# Patient Record
Sex: Female | Born: 1970
Health system: Southern US, Community
[De-identification: ages and names within clinical notes are randomized; demographics above are authoritative.]

## PROBLEM LIST (undated history)

## (undated) HISTORY — PX: BREAST CYST ASPIRATION: SHX578

---

## 2012-05-26 ENCOUNTER — Ambulatory Visit (INDEPENDENT_AMBULATORY_CARE_PROVIDER_SITE_OTHER): Payer: BC Managed Care – PPO | Admitting: General Surgery

## 2014-04-06 ENCOUNTER — Other Ambulatory Visit: Payer: Self-pay

## 2014-04-06 ENCOUNTER — Ambulatory Visit (HOSPITAL_COMMUNITY)
Admission: RE | Admit: 2014-04-06 | Discharge: 2014-04-06 | Disposition: A | Discharge status: Home / Self Care | Payer: 59 | Source: Ambulatory Visit | Attending: Family Medicine | Admitting: Family Medicine

## 2014-04-06 ENCOUNTER — Other Ambulatory Visit (HOSPITAL_COMMUNITY): Payer: Self-pay | Admitting: Family Medicine

## 2014-04-06 ENCOUNTER — Other Ambulatory Visit: Payer: Self-pay | Admitting: Family Medicine

## 2014-04-06 DIAGNOSIS — R1013 Epigastric pain: Secondary | ICD-10-CM

## 2014-04-06 DIAGNOSIS — K922 Gastrointestinal hemorrhage, unspecified: Secondary | ICD-10-CM

## 2014-04-06 MED ORDER — IOHEXOL 300 MG/ML  SOLN
80.0000 mL | Freq: Once | INTRAMUSCULAR | Status: AC | PRN
  Administered 2014-04-06: 80 mL via INTRAVENOUS

## 2014-09-22 ENCOUNTER — Other Ambulatory Visit: Payer: Self-pay | Admitting: Obstetrics & Gynecology

## 2014-09-22 ENCOUNTER — Other Ambulatory Visit (HOSPITAL_COMMUNITY)
Admission: RE | Admit: 2014-09-22 | Discharge: 2014-09-22 | Disposition: A | Discharge status: Home / Self Care | Payer: 59 | Source: Ambulatory Visit | Attending: Obstetrics & Gynecology | Admitting: Obstetrics & Gynecology

## 2014-09-22 DIAGNOSIS — Z01419 Encounter for gynecological examination (general) (routine) without abnormal findings: Secondary | ICD-10-CM | POA: Diagnosis not present

## 2014-09-22 DIAGNOSIS — Z1151 Encounter for screening for human papillomavirus (HPV): Secondary | ICD-10-CM | POA: Insufficient documentation

## 2014-09-23 LAB — CYTOLOGY - PAP

## 2016-06-20 ENCOUNTER — Other Ambulatory Visit: Payer: Self-pay | Admitting: Obstetrics & Gynecology

## 2016-06-20 DIAGNOSIS — Z1231 Encounter for screening mammogram for malignant neoplasm of breast: Secondary | ICD-10-CM

## 2016-07-01 DIAGNOSIS — L858 Other specified epidermal thickening: Secondary | ICD-10-CM | POA: Diagnosis not present

## 2016-07-01 DIAGNOSIS — F5101 Primary insomnia: Secondary | ICD-10-CM | POA: Diagnosis not present

## 2016-07-24 ENCOUNTER — Ambulatory Visit (INDEPENDENT_AMBULATORY_CARE_PROVIDER_SITE_OTHER): Payer: 59 | Admitting: Psychology

## 2016-07-24 DIAGNOSIS — F4322 Adjustment disorder with anxiety: Secondary | ICD-10-CM

## 2016-07-31 ENCOUNTER — Ambulatory Visit
Admission: RE | Admit: 2016-07-31 | Discharge: 2016-07-31 | Disposition: A | Discharge status: Home / Self Care | Payer: 59 | Source: Ambulatory Visit | Attending: Obstetrics & Gynecology | Admitting: Obstetrics & Gynecology

## 2016-07-31 DIAGNOSIS — Z1231 Encounter for screening mammogram for malignant neoplasm of breast: Secondary | ICD-10-CM | POA: Diagnosis not present

## 2016-08-16 ENCOUNTER — Ambulatory Visit (INDEPENDENT_AMBULATORY_CARE_PROVIDER_SITE_OTHER): Payer: 59 | Admitting: Psychology

## 2016-08-16 DIAGNOSIS — F331 Major depressive disorder, recurrent, moderate: Secondary | ICD-10-CM | POA: Diagnosis not present

## 2016-08-16 DIAGNOSIS — F411 Generalized anxiety disorder: Secondary | ICD-10-CM

## 2016-08-20 ENCOUNTER — Ambulatory Visit (INDEPENDENT_AMBULATORY_CARE_PROVIDER_SITE_OTHER): Payer: 59 | Admitting: Psychology

## 2016-08-20 DIAGNOSIS — F411 Generalized anxiety disorder: Secondary | ICD-10-CM

## 2016-08-20 DIAGNOSIS — F331 Major depressive disorder, recurrent, moderate: Secondary | ICD-10-CM | POA: Diagnosis not present

## 2016-08-21 ENCOUNTER — Ambulatory Visit (INDEPENDENT_AMBULATORY_CARE_PROVIDER_SITE_OTHER): Payer: 59 | Admitting: Psychology

## 2016-08-21 DIAGNOSIS — F331 Major depressive disorder, recurrent, moderate: Secondary | ICD-10-CM | POA: Diagnosis not present

## 2016-08-21 DIAGNOSIS — F411 Generalized anxiety disorder: Secondary | ICD-10-CM | POA: Diagnosis not present

## 2016-08-27 ENCOUNTER — Ambulatory Visit (INDEPENDENT_AMBULATORY_CARE_PROVIDER_SITE_OTHER): Payer: 59 | Admitting: Psychology

## 2016-08-27 DIAGNOSIS — F331 Major depressive disorder, recurrent, moderate: Secondary | ICD-10-CM

## 2016-08-27 DIAGNOSIS — F411 Generalized anxiety disorder: Secondary | ICD-10-CM | POA: Diagnosis not present

## 2016-09-25 DIAGNOSIS — Z0000 Encounter for general adult medical examination without abnormal findings: Secondary | ICD-10-CM | POA: Diagnosis not present

## 2016-09-25 DIAGNOSIS — Z23 Encounter for immunization: Secondary | ICD-10-CM | POA: Diagnosis not present

## 2016-10-17 DIAGNOSIS — M5412 Radiculopathy, cervical region: Secondary | ICD-10-CM | POA: Diagnosis not present

## 2016-10-28 ENCOUNTER — Encounter (HOSPITAL_COMMUNITY): Payer: Self-pay | Admitting: *Deleted

## 2016-10-28 ENCOUNTER — Emergency Department (HOSPITAL_COMMUNITY)
Admission: EM | Admit: 2016-10-28 | Discharge: 2016-10-28 | Disposition: A | Discharge status: Home / Self Care | Payer: 59 | Attending: Emergency Medicine | Admitting: Emergency Medicine

## 2016-10-28 DIAGNOSIS — M5410 Radiculopathy, site unspecified: Secondary | ICD-10-CM | POA: Diagnosis not present

## 2016-10-28 DIAGNOSIS — M79622 Pain in left upper arm: Secondary | ICD-10-CM | POA: Diagnosis not present

## 2016-10-28 DIAGNOSIS — R29898 Other symptoms and signs involving the musculoskeletal system: Secondary | ICD-10-CM | POA: Diagnosis not present

## 2016-10-28 DIAGNOSIS — M79602 Pain in left arm: Secondary | ICD-10-CM | POA: Diagnosis not present

## 2016-10-28 MED ORDER — KETOROLAC TROMETHAMINE 30 MG/ML IJ SOLN
15.0000 mg | Freq: Once | INTRAMUSCULAR | Status: AC
  Administered 2016-10-28: 15 mg via INTRAMUSCULAR
  Filled 2016-10-28: qty 1

## 2016-10-28 NOTE — ED Notes (Signed)
Pt has been on prednisone and last day was last Friday and since then pain in left arm has been worse today she could not get out of bed , states her fingers are numb, pt can wiggle them has great cap refill  < 3 sec and radial pulse is strong, pt states  Pain radiates down arm , in armpit and into back feels better with arm over head

## 2016-10-28 NOTE — Discharge Instructions (Signed)
Please read instructions below. Please attend your appointment with your PCP today for follow up. You will likely need an MRI if these symptoms persist. Call your orthopedic specialist or the neurosurgeon for follow up on your pain. Apply ice to your shoulder for 20 minutes at a time. You can also apply heat if that provides you with more relief. You can take advil every 6 hours as needed for pain. Return to the ER for new or concerning symptoms.

## 2016-10-28 NOTE — ED Notes (Signed)
aasked pt if she felt she could drive and she states she got here and feels like she can get home really does not feel any better, has appointment with her pcp this afternoon at 445, told her I could give her more pain if she could find a ride home and she states she would wait  Till she sees her dr

## 2016-10-28 NOTE — ED Provider Notes (Signed)
[facility]-EMERGENCY DEPT Provider Note    By signing my name below, I, Earmon Phoenix, attest that this documentation has been prepared under the direction and in the presence of [place] Russo, PA-C. Electronically Signed: Earmon Phoenix, ED Scribe. 10/28/16. 10:28 AM.    History   Chief Complaint Chief Complaint  Patient presents with  . Arm Pain   The history is provided by the patient. No language interpreter was used.    Kayla Jackson is a 46 y.o. female who presents to the Emergency Department complaining of worsening LUE pain that began two weeks ago. She reports associated paresthesias and cold sensation of the left arm. She reports moving two weeks ago and then the pain began in the posterior left shoulder. She states the pain gradually went down the LUE to the hand. She was seen by her PCP and was prescribed Prednisone which helped temporarily (prednisone taper finished on Friday). She has also taken Ibuprofen with minimal relief.  Moving the LUE increases the pain. Elevating the LUE over her head helps alleviate the pain. She denies bruising, wounds, numbness, weakness of the LUE or any known trauma or injury.  Pt reported ulnar nerve impingement in triage, however states this is a personal diagnosis, has not been medically diagnosed with this.  History reviewed. No pertinent past medical history.  There are no active problems to display for this patient.   Past Surgical History:  Procedure Laterality Date  . BREAST CYST ASPIRATION Left    calcified milk duct  . CESAREAN SECTION      OB History    No data available       Home Medications    Prior to Admission medications   Not on File    Family History No family history on file.  Social History Social History  Substance Use Topics  . Smoking status: Never Smoker  . Smokeless tobacco: Never Used  . Alcohol use Yes     Comment: occ     Allergies   Patient has no known allergies.   Review of  Systems Review of Systems  Musculoskeletal: Positive for myalgias.  Skin: Negative for color change and wound.  Neurological: Negative for weakness and numbness.     Physical Exam Updated Vital Signs BP 119/70 (BP Location: Right Arm)   Pulse 76   Temp 97.9 F (36.6 C) (Oral)   Resp 17   Ht 5\' 5"  (1.651 m)   Wt 150 lb (68 kg)   SpO2 100%   BMI 24.96 kg/m   Physical Exam  Constitutional: She appears well-developed and well-nourished. No distress.  HENT:  Head: Normocephalic and atraumatic.  Eyes: Conjunctivae are normal.  Neck: Normal range of motion.  Cardiovascular: Normal rate and intact distal pulses.   Intact radial and ulnar pulses.  Pulmonary/Chest: Effort normal.  Musculoskeletal: Normal range of motion. She exhibits no edema or deformity.  No spinal or paraspinal tenderness. No TTP over trapezius muscle. Tenderness to posterior shoulder at axilla. Remainder of LUE non tender. Normal ROM of LUE including shoulder, elbow, wrist.  Neurological: She is alert. She displays normal reflexes. No sensory deficit. She exhibits normal muscle tone. Coordination normal.  5/5 strength to BUE with elbow flexion and extension as well as grip strength. Normal gait. Normal sensation to BUE.  Psychiatric: She has a normal mood and affect. Her behavior is normal.  Nursing note and vitals reviewed.    ED Treatments / Results  DIAGNOSTIC STUDIES: Oxygen Saturation is 100% on RA,  normal by my interpretation.   COORDINATION OF CARE: 10:13 AM- Will refer to specialist. Will order injection of Toradol. Pt verbalizes understanding and agrees to plan.  Medications  ketorolac (TORADOL) 30 MG/ML injection 15 mg (15 mg Intramuscular Given 10/28/16 1044)    Labs (all labs ordered are listed, but only abnormal results are displayed) Labs Reviewed - No data to display  EKG  EKG Interpretation None       Radiology No results found.  Procedures Procedures (including critical care  time)  Medications Ordered in ED Medications  ketorolac (TORADOL) 30 MG/ML injection 15 mg (15 mg Intramuscular Given 10/28/16 1044)     Initial Impression / Assessment and Plan / ED Course  I have reviewed the triage vital signs and the nursing notes.  Pertinent labs & imaging results that were available during my care of the patient were reviewed by me and considered in my medical decision making (see chart for details).     Pt presents with neck pain that is radiating down her left arm. There has been no recent trauma. Patient without focal neuro deficits. Per Nexus criteria, c-spine imaging is not indicated at this time. Pt has been previously evaluated by their PCP and was prescribed Prednisone which she has completed. No evidence of cervical nerve root compression on physical exam. DC w conservative home therapies. Advised to f/u with her PCP if symptoms persist. Pt is safe for discharge.  Patient discussed with Dr. Jacqulyn BathLong, who agrees with care plan.  Discussed results, findings, treatment and follow up. Patient advised of return precautions. Patient verbalized understanding and agreed with plan.  Final Clinical Impressions(s) / ED Diagnoses   Final diagnoses:  Radiculopathy affecting upper extremity    New Prescriptions There are no discharge medications for this patient.  I personally performed the services described in this documentation, which was scribed in my presence. The recorded information has been reviewed and is accurate.     Russo, [place] N, PA-C 10/28/16 1740    Maia PlanLong, Joshua G, MD 10/29/16 [phone]

## 2016-10-28 NOTE — ED Triage Notes (Signed)
Pt has been taking prednisone for 9 days for L ulnar nerve impingement.  Woke up this am with severe pain and tingling and cold sensation from L shoulder to L pinky.

## 2016-10-29 ENCOUNTER — Ambulatory Visit
Admission: RE | Admit: 2016-10-29 | Discharge: 2016-10-29 | Disposition: A | Discharge status: Home / Self Care | Payer: 59 | Source: Ambulatory Visit | Attending: Family Medicine | Admitting: Family Medicine

## 2016-10-29 ENCOUNTER — Other Ambulatory Visit: Payer: Self-pay | Admitting: Family Medicine

## 2016-10-29 DIAGNOSIS — M5412 Radiculopathy, cervical region: Secondary | ICD-10-CM

## 2016-10-29 DIAGNOSIS — M4802 Spinal stenosis, cervical region: Secondary | ICD-10-CM | POA: Diagnosis not present

## 2016-10-29 MED ORDER — GADOBENATE DIMEGLUMINE 529 MG/ML IV SOLN
14.0000 mL | Freq: Once | INTRAVENOUS | Status: AC | PRN
  Administered 2016-10-29: 14 mL via INTRAVENOUS

## 2016-10-30 ENCOUNTER — Other Ambulatory Visit: Payer: Self-pay

## 2016-10-30 DIAGNOSIS — M5412 Radiculopathy, cervical region: Secondary | ICD-10-CM | POA: Diagnosis not present

## 2016-10-31 DIAGNOSIS — M5020 Other cervical disc displacement, unspecified cervical region: Secondary | ICD-10-CM | POA: Diagnosis not present

## 2016-10-31 DIAGNOSIS — M5412 Radiculopathy, cervical region: Secondary | ICD-10-CM | POA: Diagnosis not present

## 2016-11-01 DIAGNOSIS — M5412 Radiculopathy, cervical region: Secondary | ICD-10-CM | POA: Diagnosis not present

## 2016-11-01 DIAGNOSIS — M5020 Other cervical disc displacement, unspecified cervical region: Secondary | ICD-10-CM | POA: Diagnosis not present

## 2016-11-06 DIAGNOSIS — M50223 Other cervical disc displacement at C6-C7 level: Secondary | ICD-10-CM | POA: Diagnosis not present

## 2016-11-06 DIAGNOSIS — M50123 Cervical disc disorder at C6-C7 level with radiculopathy: Secondary | ICD-10-CM | POA: Diagnosis not present

## 2016-11-26 DIAGNOSIS — M5020 Other cervical disc displacement, unspecified cervical region: Secondary | ICD-10-CM | POA: Diagnosis not present

## 2017-01-30 DIAGNOSIS — Z538 Procedure and treatment not carried out for other reasons: Secondary | ICD-10-CM | POA: Diagnosis not present

## 2017-02-28 DIAGNOSIS — R030 Elevated blood-pressure reading, without diagnosis of hypertension: Secondary | ICD-10-CM | POA: Diagnosis not present

## 2017-02-28 DIAGNOSIS — M5412 Radiculopathy, cervical region: Secondary | ICD-10-CM | POA: Diagnosis not present

## 2017-02-28 DIAGNOSIS — M5020 Other cervical disc displacement, unspecified cervical region: Secondary | ICD-10-CM | POA: Diagnosis not present

## 2017-03-27 DIAGNOSIS — L720 Epidermal cyst: Secondary | ICD-10-CM | POA: Diagnosis not present

## 2017-05-13 DIAGNOSIS — L720 Epidermal cyst: Secondary | ICD-10-CM | POA: Diagnosis not present

## 2017-10-08 DIAGNOSIS — Z13220 Encounter for screening for lipoid disorders: Secondary | ICD-10-CM | POA: Diagnosis not present

## 2017-10-08 DIAGNOSIS — Z5181 Encounter for therapeutic drug level monitoring: Secondary | ICD-10-CM | POA: Diagnosis not present

## 2017-10-08 DIAGNOSIS — Z0000 Encounter for general adult medical examination without abnormal findings: Secondary | ICD-10-CM | POA: Diagnosis not present

## 2018-02-10 DIAGNOSIS — M25551 Pain in right hip: Secondary | ICD-10-CM | POA: Diagnosis not present

## 2018-02-23 NOTE — Progress Notes (Signed)
[name] [address] [place], [place] [address] Phone: [phone] Subjective:    I [name] am serving as a Neurosurgeon for Dr. Antoine Primas.   I'm seeing this patient by the request  of:  Shirlean Mylar, MD   CC: Right hip pain  [id number]  Kayla Jackson is a 47 y.o. female coming in with complaint of right hip pain. Some days she can barley walk. Sometimes wakes her up from sleep. Has pain in bilateral SI joints. Family history of RA.  Had been related bike more frequently but he did not seem to cause more pain.  Now walking patient does have to take a break.  States some mild back pain associated with it  Onset- Chronic Location- Lateral (deep) Character- Dull ache Aggravating factors- Walking, squatting, picking things up  Reliving factors-  Therapies tried-changing her workout routine Severity-sometimes 8 out of 10 and can stop her from walking     No past medical history on file. Past Surgical History:  Procedure Laterality Date  . BREAST CYST ASPIRATION Left    calcified milk duct  . CESAREAN SECTION     Social History   Socioeconomic History  . Marital status: Married    Spouse name: Not on file  . Number of children: Not on file  . Years of education: Not on file  . Highest education level: Not on file  Occupational History  . Not on file  Social Needs  . Financial resource strain: Not on file  . Food insecurity:    Worry: Not on file    Inability: Not on file  . Transportation needs:    Medical: Not on file    Non-medical: Not on file  Tobacco Use  . Smoking status: Never Smoker  . Smokeless tobacco: Never Used  Substance and Sexual Activity  . Alcohol use: Yes    Comment: occ  . Drug use: No  . Sexual activity: Not on file  Lifestyle  . Physical activity:    Days per week: Not on file    Minutes per session: Not on file  . Stress: Not on file  Relationships  . Social connections:    Talks on phone:  Not on file    Gets together: Not on file    Attends religious service: Not on file    Active member of club or organization: Not on file    Attends meetings of clubs or organizations: Not on file    Relationship status: Not on file  Other Topics Concern  . Not on file  Social History Narrative  . Not on file   No Known Allergies No family history on file. No current outpatient medications on file.    Past medical history, social, surgical and family history all reviewed in electronic medical record.  No pertanent information unless stated regarding to the chief complaint.   Review of Systems:  No headache, visual changes, nausea, vomiting, diarrhea, constipation, dizziness, abdominal pain, skin rash, fevers, chills, night sweats, weight loss, swollen lymph nodes, body aches, joint swelling,  chest pain, shortness of breath, mood changes.  Positive muscle aches  Objective  Blood pressure 102/86, pulse 76, height 5\' 5"  (1.651 m), weight 170 lb (77.1 kg), SpO2 99 %.    General: No apparent distress alert and oriented x3 mood and affect normal, dressed appropriately.  HEENT: Pupils equal, extraocular movements intact  Respiratory: Patient's speak in full sentences and does not appear short of  breath  Cardiovascular: No lower extremity edema, non tender, no erythema  Skin: Warm dry intact with no signs of infection or rash on extremities or on axial skeleton.  Abdomen: Soft nontender  Neuro: Cranial nerves II through XII are intact, neurovascularly intact in all extremities with 2+ DTRs and 2+ pulses.  Lymph: No lymphadenopathy of posterior or anterior cervical chain or axillae bilaterally.  Gait antalgic  MSK:  Non tender with full range of motion and good stability and symmetric strength and tone of shoulders, elbows, wrist,  knee and ankles bilaterally.  Hip: Right hip ROM IR: 25 Deg, ER: 45 Deg, Flexion: 120 Deg, Extension: 100 Deg, Abduction: 45 Deg, Adduction: 45  Deg Strength IR: 5/5, ER: 5/5, Flexion: 5/5, Extension: 5/5, Abduction: 5/5, Adduction: 5/5 Pelvic alignment unremarkable to inspection and palpation. Standing hip rotation and gait without trendelenburg sign / unsteadiness. Mild pain over the greater trochanteric area but positive grind test with internal range of motion compression of the joint Moderate pain of the right sacroiliac joint  Osteopathic findingsft T3 extended rotated and side bent right inhaled third rib T9 extended rotated and side bent left L3 flexed rotated and side bent right Sacrum right on right    Impression and Recommendations:     This case required medical decision making of moderate complexity. The above documentation has been reviewed and is accurate and complete Kayla Saa, DO       Note: This dictation was prepared with Dragon dictation along with smaller phrase technology. Any transcriptional errors that result from this process are unintentional.

## 2018-02-24 ENCOUNTER — Other Ambulatory Visit (INDEPENDENT_AMBULATORY_CARE_PROVIDER_SITE_OTHER): Payer: 59

## 2018-02-24 ENCOUNTER — Ambulatory Visit (INDEPENDENT_AMBULATORY_CARE_PROVIDER_SITE_OTHER): Payer: 59 | Admitting: Family Medicine

## 2018-02-24 ENCOUNTER — Ambulatory Visit (INDEPENDENT_AMBULATORY_CARE_PROVIDER_SITE_OTHER)
Admission: RE | Admit: 2018-02-24 | Discharge: 2018-02-24 | Disposition: A | Discharge status: Home / Self Care | Payer: 59 | Source: Ambulatory Visit | Attending: Family Medicine | Admitting: Family Medicine

## 2018-02-24 ENCOUNTER — Encounter: Payer: Self-pay | Admitting: Family Medicine

## 2018-02-24 VITALS — BP 102/86 | HR 76 | Ht 65.0 in | Wt 170.0 lb

## 2018-02-24 DIAGNOSIS — G8929 Other chronic pain: Secondary | ICD-10-CM

## 2018-02-24 DIAGNOSIS — M999 Biomechanical lesion, unspecified: Secondary | ICD-10-CM | POA: Diagnosis not present

## 2018-02-24 DIAGNOSIS — M25851 Other specified joint disorders, right hip: Secondary | ICD-10-CM

## 2018-02-24 DIAGNOSIS — M533 Sacrococcygeal disorders, not elsewhere classified: Secondary | ICD-10-CM | POA: Diagnosis not present

## 2018-02-24 DIAGNOSIS — M2550 Pain in unspecified joint: Secondary | ICD-10-CM

## 2018-02-24 DIAGNOSIS — M25551 Pain in right hip: Secondary | ICD-10-CM

## 2018-02-24 DIAGNOSIS — M1611 Unilateral primary osteoarthritis, right hip: Secondary | ICD-10-CM | POA: Diagnosis not present

## 2018-02-24 LAB — IBC PANEL
Iron: 126 ug/dL (ref 42–145)
SATURATION RATIOS: 38.5 % (ref 20.0–50.0)
Transferrin: 234 mg/dL (ref 212.0–360.0)

## 2018-02-24 LAB — CBC WITH DIFFERENTIAL/PLATELET
BASOS ABS: 0 10*3/uL (ref 0.0–0.1)
BASOS PCT: 0.8 % (ref 0.0–3.0)
EOS ABS: 0.1 10*3/uL (ref 0.0–0.7)
Eosinophils Relative: 1.6 % (ref 0.0–5.0)
HCT: 41.2 % (ref 36.0–46.0)
HEMOGLOBIN: 14.2 g/dL (ref 12.0–15.0)
Lymphocytes Relative: 21.7 % (ref 12.0–46.0)
Lymphs Abs: 1.2 10*3/uL (ref 0.7–4.0)
MCHC: 34.5 g/dL (ref 30.0–36.0)
MCV: 89.8 fl (ref 78.0–100.0)
Monocytes Absolute: 0.4 10*3/uL (ref 0.1–1.0)
Monocytes Relative: 6.4 % (ref 3.0–12.0)
Neutro Abs: 4 10*3/uL (ref 1.4–7.7)
Neutrophils Relative %: 69.5 % (ref 43.0–77.0)
PLATELETS: 215 10*3/uL (ref 150.0–400.0)
RBC: 4.59 Mil/uL (ref 3.87–5.11)
RDW: 12.4 % (ref 11.5–15.5)
WBC: 5.7 10*3/uL (ref 4.0–10.5)

## 2018-02-24 LAB — COMPREHENSIVE METABOLIC PANEL
ALBUMIN: 4.6 g/dL (ref 3.5–5.2)
ALT: 13 U/L (ref 0–35)
AST: 13 U/L (ref 0–37)
Alkaline Phosphatase: 42 U/L (ref 39–117)
BUN: 15 mg/dL (ref 6–23)
CHLORIDE: 105 meq/L (ref 96–112)
CO2: 28 meq/L (ref 19–32)
CREATININE: 0.75 mg/dL (ref 0.40–1.20)
Calcium: 9.3 mg/dL (ref 8.4–10.5)
GFR: 86.99 mL/min (ref >60.00)
Glucose, Bld: 102 mg/dL — ABNORMAL HIGH (ref 70–99)
Potassium: 3.9 mEq/L (ref 3.5–5.1)
SODIUM: 139 meq/L (ref 135–145)
Total Bilirubin: 0.7 mg/dL (ref 0.2–1.2)
Total Protein: 7.7 g/dL (ref 6.0–8.3)

## 2018-02-24 LAB — SEDIMENTATION RATE: Sed Rate: 8 mm/hr (ref 0–20)

## 2018-02-24 LAB — C-REACTIVE PROTEIN

## 2018-02-24 LAB — URIC ACID: Uric Acid, Serum: 3.7 mg/dL (ref 2.4–7.0)

## 2018-02-24 LAB — TSH: TSH: 1.39 u[IU]/mL (ref 0.35–4.50)

## 2018-02-24 LAB — FERRITIN: Ferritin: 126.6 ng/mL (ref 10.0–291.0)

## 2018-02-24 NOTE — Assessment & Plan Note (Signed)
Decision today to treat with OMT was based on Physical Exam  After verbal consent patient was treated with HVLA, ME, FPR techniques in  thoracic, lumbar and sacral areas  Patient tolerated the procedure well with improvement in symptoms  Patient given exercises, stretches and lifestyle modifications  See medications in patient instructions if given  Patient will follow up in 4 weeks 

## 2018-02-24 NOTE — Assessment & Plan Note (Signed)
Sacroiliac joint dysfunction.  Discussed posture, hip abductor strengthening, icing regimen.  Follow-up again in 4 to 6 weeks

## 2018-02-24 NOTE — Assessment & Plan Note (Signed)
Concern for the right hip impingement.  Patient seems to be more anterior.  Patient has been landing on bike and this could be secondary to the positioning.  Sacroiliac dysfunction also noted.  Attempted osteopathic manipulation.  With patient's family history will get laboratory work-up to rule out autoimmune disease or any other type of inflammatory markers that could be contributing.  We discussed posture and ergonomics, we discussed proper shoes.  Discussed icing regimen and topical anti-inflammatories given.  Follow-up again in 3 to 4 weeks for further evaluation and treatment

## 2018-02-24 NOTE — Patient Instructions (Signed)
Good to see you  We will get labs and xray downstairs Exercises 3 times a week.  pennsaid pinkie amount topically 2 times daily as needed.  Ice 20 minutes 2 times daily. Usually after activity and before bed. Lower the seat height about 1/2 inch or so.  Lets see what the labs show and see what else we need to do  See me again in 3-4 weeks and see how long the manipulation held you

## 2018-02-27 LAB — VITAMIN D 1,25 DIHYDROXY
VITAMIN D3 1, 25 (OH): 38 pg/mL
Vitamin D 1, 25 (OH)2 Total: 38 pg/mL (ref 18–72)
Vitamin D2 1, 25 (OH)2: <8 pg/mL

## 2018-02-27 LAB — CYCLIC CITRUL PEPTIDE ANTIBODY, IGG: Cyclic Citrullin Peptide Ab: 19 UNITS

## 2018-02-27 LAB — ANTI-NUCLEAR AB-TITER (ANA TITER): ANA Titer 1: 1:40 {titer} — ABNORMAL HIGH

## 2018-02-27 LAB — ANGIOTENSIN CONVERTING ENZYME: ANGIOTENSIN-CONVERTING ENZYME: 30 U/L (ref 9–67)

## 2018-02-27 LAB — ANA: Anti Nuclear Antibody(ANA): POSITIVE — AB

## 2018-02-27 LAB — RHEUMATOID FACTOR

## 2018-02-27 LAB — CALCIUM, IONIZED: CALCIUM ION: 4.93 mg/dL (ref 4.8–5.6)

## 2018-02-27 LAB — PTH, INTACT AND CALCIUM
CALCIUM: 9.5 mg/dL (ref 8.6–10.2)
PTH: 49 pg/mL (ref 14–64)

## 2018-03-19 NOTE — Progress Notes (Signed)
[facility] [address] [place], [place] [address] Phone: [phone] Subjective:   Kayla Jackson, am serving as a scribe for Dr. Antoine Primas.  I'm seeing this patient by the request  of:    CC: Right hip and back pain follow-up  [id number]  Kayla Jackson is a 47 y.o. female coming in with complaint of back pain. She said that she is improving. Her SI joint pain has decreased. Her right hip continues to bother her. Pain with walking on anterior thigh. Is able to ride stationary bike without problems. Pain is sharp. Did try exercises which did not help.        History reviewed. No pertinent past medical history. Past Surgical History:  Procedure Laterality Date  . BREAST CYST ASPIRATION Left    calcified milk duct  . CESAREAN SECTION     Social History   Socioeconomic History  . Marital status: Married    Spouse name: Not on file  . Number of children: Not on file  . Years of education: Not on file  . Highest education level: Not on file  Occupational History  . Not on file  Social Needs  . Financial resource strain: Not on file  . Food insecurity:    Worry: Not on file    Inability: Not on file  . Transportation needs:    Medical: Not on file    Non-medical: Not on file  Tobacco Use  . Smoking status: Never Smoker  . Smokeless tobacco: Never Used  Substance and Sexual Activity  . Alcohol use: Yes    Comment: occ  . Drug use: No  . Sexual activity: Not on file  Lifestyle  . Physical activity:    Days per week: Not on file    Minutes per session: Not on file  . Stress: Not on file  Relationships  . Social connections:    Talks on phone: Not on file    Gets together: Not on file    Attends religious service: Not on file    Active member of club or organization: Not on file    Attends meetings of clubs or organizations: Not on file    Relationship status: Not on file  Other Topics Concern  . Not on file  Social  History Narrative  . Not on file   No Known Allergies History reviewed. No pertinent family history.  No family history [place] No current outpatient medications on file.    Past medical history, social, surgical and family history all reviewed in electronic medical record.  No pertanent information unless stated regarding to the chief complaint.   Review of Systems:  No headache, visual changes, nausea, vomiting, diarrhea, constipation, dizziness, abdominal pain, skin rash, fevers, chills, night sweats, weight loss, swollen lymph nodes, body aches, joint swelling,  chest pain, shortness of breath, mood changes.  Positive muscle aches  Objective  Blood pressure 122/88, pulse 84, height 5\' 5"  (1.651 m), weight 175 lb (79.4 kg), SpO2 98 %.    General: No apparent distress alert and oriented x3 mood and affect normal, dressed appropriately.  HEENT: Pupils equal, extraocular movements intact  Respiratory: Patient's speak in full sentences and does not appear short of breath  Cardiovascular: No lower extremity edema, non tender, no erythema  Skin: Warm dry intact with no signs of infection or rash on extremities or on axial skeleton.  Abdomen: Soft nontender  Neuro: Cranial nerves II through XII are intact, neurovascularly  intact in all extremities with 2+ DTRs and 2+ pulses.  Lymph: No lymphadenopathy of posterior or anterior cervical chain or axillae bilaterally.  Gait normal with good balance and coordination.  MSK:  Non tender with full range of motion and good stability and symmetric strength and tone of shoulders, elbows, wrist, , knee and ankles bilaterally.  Impingement noted with internal range of motion and mild flexion on left forward flexion.  Mild positive Pearlean BrownieFaber as well.  Osteopathic findings C4 flexed rotated and side bent left C6 flexed rotated and side bent left T3 extended rotated and side bent right inhaled third rib T8 extended rotated and side bent left L1 flexed  rotated and side bent right Sacrum right on right    Impression and Recommendations:     This case required medical decision making of moderate complexity. The above documentation has been reviewed and is accurate and complete Kayla SaaZachary M Kenly Xiao, DO       Note: This dictation was prepared with Dragon dictation along with smaller phrase technology. Any transcriptional errors that result from this process are unintentional.

## 2018-03-20 ENCOUNTER — Ambulatory Visit (INDEPENDENT_AMBULATORY_CARE_PROVIDER_SITE_OTHER): Payer: 59 | Admitting: Family Medicine

## 2018-03-20 ENCOUNTER — Encounter: Payer: Self-pay | Admitting: Family Medicine

## 2018-03-20 VITALS — BP 122/88 | HR 84 | Ht 65.0 in | Wt 175.0 lb

## 2018-03-20 DIAGNOSIS — M25851 Other specified joint disorders, right hip: Secondary | ICD-10-CM | POA: Diagnosis not present

## 2018-03-20 DIAGNOSIS — M999 Biomechanical lesion, unspecified: Secondary | ICD-10-CM | POA: Diagnosis not present

## 2018-03-20 NOTE — Patient Instructions (Addendum)
Good to see you  Kayla Jackson is your friend Stay active Keep it up  We will watch the hip  See me again in 5-6 weeks

## 2018-03-20 NOTE — Assessment & Plan Note (Signed)
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  Discussed icing regimen and home exercise.  Which activities to do.  Patient will follow-up again in 4 to 6 weeks.  Worsening symptoms consider injection

## 2018-04-23 ENCOUNTER — Ambulatory Visit: Payer: 59

## 2018-04-29 ENCOUNTER — Encounter: Payer: Self-pay | Admitting: Family Medicine

## 2018-04-29 ENCOUNTER — Ambulatory Visit (INDEPENDENT_AMBULATORY_CARE_PROVIDER_SITE_OTHER): Payer: 59 | Admitting: Family Medicine

## 2018-04-29 VITALS — BP 110/70 | HR 74 | Ht 65.0 in | Wt 168.0 lb

## 2018-04-29 DIAGNOSIS — M533 Sacrococcygeal disorders, not elsewhere classified: Secondary | ICD-10-CM

## 2018-04-29 DIAGNOSIS — M999 Biomechanical lesion, unspecified: Secondary | ICD-10-CM

## 2018-04-29 NOTE — Progress Notes (Signed)
[facility] [address] [place], [place] [address] Phone: [phone] Subjective:    I Kayla Jackson am serving as a Neurosurgeon for Dr. Antoine Primas.    CC: Right hip pain follow-up  [id number]  Kayla Jackson is a 48 y.o. female coming in with complaint of right hip pain. States that she is slowly getting better. Patient is x-ray showing that there is a possibility for possible impingement.  In addition to this patient does have more sacroiliac dysfunction.  Does state that she is making improvement.  Never went to formal physical therapy.  Patient denies any true radiation down the leg at the moment.  Patient has been a little more active and has started doing such things as yoga and cycling somewhat.     No past medical history on file. Past Surgical History:  Procedure Laterality Date  . BREAST CYST ASPIRATION Left    calcified milk duct  . CESAREAN SECTION     Social History   Socioeconomic History  . Marital status: Married    Spouse name: Not on file  . Number of children: Not on file  . Years of education: Not on file  . Highest education level: Not on file  Occupational History  . Not on file  Social Needs  . Financial resource strain: Not on file  . Food insecurity:    Worry: Not on file    Inability: Not on file  . Transportation needs:    Medical: Not on file    Non-medical: Not on file  Tobacco Use  . Smoking status: Never Smoker  . Smokeless tobacco: Never Used  Substance and Sexual Activity  . Alcohol use: Yes    Comment: occ  . Drug use: No  . Sexual activity: Not on file  Lifestyle  . Physical activity:    Days per week: Not on file    Minutes per session: Not on file  . Stress: Not on file  Relationships  . Social connections:    Talks on phone: Not on file    Gets together: Not on file    Attends religious service: Not on file    Active member of club or organization: Not on file    Attends meetings of  clubs or organizations: Not on file    Relationship status: Not on file  Other Topics Concern  . Not on file  Social History Narrative  . Not on file   No Known Allergies No family history on file. No current outpatient medications on file.    Past medical history, social, surgical and family history all reviewed in electronic medical record.  No pertanent information unless stated regarding to the chief complaint.   Review of Systems:  No headache, visual changes, nausea, vomiting, diarrhea, constipation, dizziness, abdominal pain, skin rash, fevers, chills, night sweats, weight loss, swollen lymph nodes, body aches, joint swelling, muscle aches, chest pain, shortness of breath, mood changes.   Objective  There were no vitals taken for this visit. Systems examined below as of    General: No apparent distress alert and oriented x3 mood and affect normal, dressed appropriately.  HEENT: Pupils equal, extraocular movements intact  Respiratory: Patient's speak in full sentences and does not appear short of breath  Cardiovascular: No lower extremity edema, non tender, no erythema  Skin: Warm dry intact with no signs of infection or rash on extremities or on axial skeleton.  Abdomen: Soft nontender  Neuro: Cranial nerves  II through XII are intact, neurovascularly intact in all extremities with 2+ DTRs and 2+ pulses.  Lymph: No lymphadenopathy of posterior or anterior cervical chain or axillae bilaterally.  Gait normal with good balance and coordination.  MSK:  Non tender with full range of motion and good stability and symmetric strength and tone of shoulders, elbows, wrist, , knee and ankles bilaterally.  Back Exam:  Inspection: Mild loss of lordosis Motion: Flexion 45 deg, Extension 35 deg, Side Bending to 25 deg bilaterally,  Rotation to 35 deg bilaterally  SLR laying: Negative  XSLR laying: Negative  Palpable tenderness: Tightness around the right sacroiliac joint. FABER:  Tightness of the right. Sensory change: Gross sensation intact to all lumbar and sacral dermatomes.  Reflexes: 2+ at both patellar tendons, 2+ at achilles tendons, Babinski's downgoing.  Strength at foot  Plantar-flexion: 5/5 Dorsi-flexion: 5/5 Eversion: 5/5 Inversion: 5/5  Leg strength  Quad: 5/5 Hamstring: 5/5 Hip flexor: 5/5 Hip abductors: 4/5 right compared to left Gait unremarkable.  Osteopathic findings C6 flexed rotated and side bent left T9 extended rotated and side bent left L3 flexed rotated and side bent right Sacrum right on right    Impression and Recommendations:     This case required medical decision making of moderate complexity. The above documentation has been reviewed and is accurate and complete Kayla Jackson       Note: This dictation was prepared with Dragon dictation along with smaller phrase technology. Any transcriptional errors that result from this process are unintentional.

## 2018-04-29 NOTE — Assessment & Plan Note (Signed)
Mostly sacroiliac dysfunction.  Continue to work on hip abductor strengthening.  Discussed the possibility of formal physical therapy.  Patient does have x-ray imaging of the hip that does show potential for impingement and could be contributing to some of the discomfort and pain.  Follow-up with me again in 4 to 6 weeks

## 2018-04-29 NOTE — Assessment & Plan Note (Signed)
Decision today to treat with OMT was based on Physical Exam  After verbal consent patient was treated with HVLA, ME, FPR techniques in , thoracic, lumbar and sacral areas  Patient tolerated the procedure well with improvement in symptoms  Patient given exercises, stretches and lifestyle modifications  See medications in patient instructions if given  Patient will follow up in 6-8 weeks 

## 2018-04-29 NOTE — Patient Instructions (Addendum)
Great to ee you  Overall you are doing well  Keep being active Core strength jis key  See me again in 8 weeks!

## 2018-06-16 ENCOUNTER — Ambulatory Visit: Payer: 59 | Admitting: Family Medicine

## 2018-06-19 ENCOUNTER — Ambulatory Visit: Payer: 59 | Admitting: Family Medicine

## 2018-07-16 ENCOUNTER — Ambulatory Visit: Payer: 59 | Admitting: Family Medicine

## 2018-08-18 ENCOUNTER — Encounter: Payer: Self-pay | Admitting: Family Medicine

## 2018-08-18 ENCOUNTER — Other Ambulatory Visit: Payer: Self-pay

## 2018-08-18 ENCOUNTER — Ambulatory Visit: Payer: 59 | Admitting: Family Medicine

## 2018-08-18 VITALS — BP 104/60 | HR 67 | Ht 65.0 in | Wt 168.0 lb

## 2018-08-18 DIAGNOSIS — M999 Biomechanical lesion, unspecified: Secondary | ICD-10-CM

## 2018-08-18 DIAGNOSIS — M25851 Other specified joint disorders, right hip: Secondary | ICD-10-CM | POA: Diagnosis not present

## 2018-08-18 NOTE — Progress Notes (Signed)
Tawana ScaleZach Alyss Granato D.O. [facility] [address] [place], [place] [address] Phone: [phone] Subjective:   Bruce Donath, Valerie Wolf, am serving as a scribe for Dr. Antoine PrimasZachary Moira Umholtz.   CC: Back and hip pain  OZH:[id number]:Subjective  Kayla Jackson is a 48 y.o. female coming in with complaint of back and hip pain. Patient states that her hip is hurting less frequently. Does still have pain with single leg activities and is scared to run because when the pain occurs it is worse than before. Lower back pain is at a dull ache. Hip impingement.  Patient is being evaluated occurs is worse with the frequency is better.  Not running as regularly.  Doing the exercises intermittently.    No past medical history on file. Past Surgical History:  Procedure Laterality Date  . BREAST CYST ASPIRATION Left    calcified milk duct  . CESAREAN SECTION     Social History   Socioeconomic History  . Marital status: Married    Spouse name: Not on file  . Number of children: Not on file  . Years of education: Not on file  . Highest education level: Not on file  Occupational History  . Not on file  Social Needs  . Financial resource strain: Not on file  . Food insecurity:    Worry: Not on file    Inability: Not on file  . Transportation needs:    Medical: Not on file    Non-medical: Not on file  Tobacco Use  . Smoking status: Never Smoker  . Smokeless tobacco: Never Used  Substance and Sexual Activity  . Alcohol use: Yes    Comment: occ  . Drug use: No  . Sexual activity: Not on file  Lifestyle  . Physical activity:    Days per week: Not on file    Minutes per session: Not on file  . Stress: Not on file  Relationships  . Social connections:    Talks on phone: Not on file    Gets together: Not on file    Attends religious service: Not on file    Active member of club or organization: Not on file    Attends meetings of clubs or organizations: Not on file    Relationship status: Not on file  Other  Topics Concern  . Not on file  Social History Narrative  . Not on file   No Known Allergies No family history on file.  No family history of autoimmune No current outpatient medications on file.    Past medical history, social, surgical and family history all reviewed in electronic medical record.  No pertanent information unless stated regarding to the chief complaint.   Review of Systems:  No headache, visual changes, nausea, vomiting, diarrhea, constipation, dizziness, abdominal pain, skin rash, fevers, chills, night sweats, weight loss, swollen lymph nodes, body aches, joint swelling, , chest pain, shortness of breath, mood changes.  Positive muscle aches  Objective  Blood pressure 104/60, pulse 67, height 5\' 5"  (1.651 m), weight 168 lb (76.2 kg), SpO2 98 %.    General: No apparent distress alert and oriented x3 mood and affect normal, dressed appropriately.  HEENT: Pupils equal, extraocular movements intact  Respiratory: Patient's speak in full sentences and does not appear short of breath  Cardiovascular: No lower extremity edema, non tender, no erythema  Skin: Warm dry intact with no signs of infection or rash on extremities or on axial skeleton.  Abdomen: Soft nontender  Neuro: Cranial nerves  II through XII are intact, neurovascularly intact in all extremities with 2+ DTRs and 2+ pulses.  Lymph: No lymphadenopathy of posterior or anterior cervical chain or axillae bilaterally.  Gait normal with good balance and coordination.  MSK:  Non tender with full range of motion and good stability and symmetric strength and tone of shoulders, elbows, wrist, hip, knee and ankles bilaterally.  Mild positive impingement on the right hip Back Exam:  Inspection: Unremarkable  Motion: Flexion 35 deg, Extension 15 deg, Side Bending to 45 deg bilaterally,  Rotation to 45 deg bilaterally  SLR laying: Negative  XSLR laying: Negative  Palpable tenderness: Tender to palpation paraspinal  musculature lumbar spine right greater than left) iliac joint. FABER: negative. Sensory change: Gross sensation intact to all lumbar and sacral dermatomes.  Reflexes: 2+ at both patellar tendons, 2+ at achilles tendons, Babinski's downgoing.  Strength at foot  Plantar-flexion: 5/5 Dorsi-flexion: 5/5 Eversion: 5/5 Inversion: 5/5  Leg strength  Quad: 5/5 Hamstring: 5/5 Hip flexor: 5/5 Hip abductors: 5/5  Gait unremarkable.  Osteopathic findings  C4 flexed rotated and side bent left C6 flexed rotated and side bent left T3 extended rotated and side bent right inhaled third rib T6 extended rotated and side bent left L3 flexed rotated and side bent right Sacrum right on right      Impression and Recommendations:     This case required medical decision making of moderate complexity. The above documentation has been reviewed and is accurate and complete [name], DO       Note: This dictation was prepared with Dragon dictation along with smaller phrase technology. Any transcriptional errors that result from this process are unintentional.

## 2018-08-18 NOTE — Patient Instructions (Signed)
Good to see you  Ice 20 minutes 2 times daily. Usually after activity and before bed. Keep working on hip abductors See me again in 6 weeks Be safe!

## 2018-08-18 NOTE — Assessment & Plan Note (Signed)
   Decision today to treat with OMT was based on Physical Exam  After verbal consent patient was treated with HVLA, ME, FPR techniques in  thoracic, lumbar and sacral areas  Patient tolerated the procedure well with improvement in symptoms  Patient given exercises, stretches and lifestyle modifications  See medications in patient instructions if given  Patient will follow up in6 weeks 

## 2018-08-18 NOTE — Assessment & Plan Note (Signed)
Impingement of the hip.  Discussed with patient in great length.  We discussed that this continues to give her some discomfort and pain.  Has responded well to manipulation for the sacroiliac joint.  Patient will hold on any other interventions such as injections.  Follow-up again in 6 weeks

## 2018-09-29 ENCOUNTER — Other Ambulatory Visit: Payer: Self-pay

## 2018-09-29 ENCOUNTER — Encounter: Payer: Self-pay | Admitting: Family Medicine

## 2018-09-29 ENCOUNTER — Ambulatory Visit (INDEPENDENT_AMBULATORY_CARE_PROVIDER_SITE_OTHER): Payer: 59 | Admitting: Family Medicine

## 2018-09-29 VITALS — BP 112/74 | HR 80 | Ht 65.0 in | Wt 167.0 lb

## 2018-09-29 DIAGNOSIS — M533 Sacrococcygeal disorders, not elsewhere classified: Secondary | ICD-10-CM | POA: Diagnosis not present

## 2018-09-29 DIAGNOSIS — M999 Biomechanical lesion, unspecified: Secondary | ICD-10-CM

## 2018-09-29 NOTE — Patient Instructions (Signed)
Good to see you.  When a flare 3 IBU 3x a day for 3 days Stretch after activity See me again in 5 weeks

## 2018-09-29 NOTE — Assessment & Plan Note (Signed)
Patient doing relatively well.  We discussed icing regimen and home exercises.  We discussed again about anti-inflammatories when having a flare.  We discussed core strengthening.  Has been doing very well with bike riding.  Follow-up again in 5 to 6 weeks

## 2018-09-29 NOTE — Progress Notes (Signed)
[facility] [place], [place] [address] Phone: [phone] Subjective:   Kayla Jackson, am serving as a scribe for Dr. Hulan Jackson.   CC: back and hip pain   GQQ:[id number]   08/18/2018: Impingement of the hip.  Discussed with patient in great length.  We discussed that this continues to give her some discomfort and pain.  Has responded well to manipulation for the sacroiliac joint.  Patient will hold on any other interventions such as injections.  Follow-up again in 6 weeks  Update 09/29/2018: Kayla Jackson is a 48 y.o. female coming in with complaint of right hip pain. States that the right side is better. One week after last visit she said she had a lot of pain but that has subsided. Is having pain over left SI joint. Pain is waking patient up at night. Has been walking more than usual with her kids.     Jackson past medical history on file. Past Surgical History:  Procedure Laterality Date  . BREAST CYST ASPIRATION Left    calcified milk duct  . CESAREAN SECTION     Social History   Socioeconomic History  . Marital status: Married    Spouse name: Not on file  . Number of children: Not on file  . Years of education: Not on file  . Highest education level: Not on file  Occupational History  . Not on file  Social Needs  . Financial resource strain: Not on file  . Food insecurity    Worry: Not on file    Inability: Not on file  . Transportation needs    Medical: Not on file    Non-medical: Not on file  Tobacco Use  . Smoking status: Never Smoker  . Smokeless tobacco: Never Used  Substance and Sexual Activity  . Alcohol use: Yes    Comment: occ  . Drug use: Jackson  . Sexual activity: Not on file  Lifestyle  . Physical activity    Days per week: Not on file    Minutes per session: Not on file  . Stress: Not on file  Relationships  . Social Herbalist on phone: Not on file    Gets together: Not on file    Attends  religious service: Not on file    Active member of club or organization: Not on file    Attends meetings of clubs or organizations: Not on file    Relationship status: Not on file  Other Topics Concern  . Not on file  Social History Narrative  . Not on file   Jackson Known Allergies Jackson family history on file.  Jackson family history of autoimmune Jackson current outpatient medications on file.    Past medical history, social, surgical and family history all reviewed in electronic medical record.  Jackson pertanent information unless stated regarding to the chief complaint.   Review of Systems:  Jackson headache, visual changes, nausea, vomiting, diarrhea, constipation, dizziness, abdominal pain, skin rash, fevers, chills, night sweats, weight loss, swollen lymph nodes, body aches, joint swelling, muscle aches, chest pain, shortness of breath, mood changes.   Objective  Blood pressure 112/74, pulse 80, height 5\' 5"  (1.651 Jackson), weight 167 lb (75.8 kg), SpO2 98 %.   General: Jackson apparent distress alert and oriented x3 mood and affect normal, dressed appropriately.  HEENT: Pupils equal, extraocular movements intact  Respiratory: Patient's speak in full sentences and does not appear short of breath  Cardiovascular: Jackson lower extremity edema, non tender, Jackson erythema  Skin: Warm dry intact with Jackson signs of infection or rash on extremities or on axial skeleton.  Abdomen: Soft nontender  Neuro: Cranial nerves II through XII are intact, neurovascularly intact in all extremities with 2+ DTRs and 2+ pulses.  Lymph: Jackson lymphadenopathy of posterior or anterior cervical chain or axillae bilaterally.  Gait normal with good balance and coordination.  MSK:  Non tender with full range of motion and good stability and symmetric strength and tone of shoulders, elbows, wrist, hip, knee and ankles bilaterally.    Back - Normal skin, Spine with normal alignment and Jackson deformity.  Jackson tenderness to vertebral process palpation.   Paraspinous muscles are not tender and without spasm.   Range of motion is full at neck and lumbar sacral regions  Right hip still mild anterior impingement.   Osteopathic findings  T9 extended rotated and side bent left L2 flexed rotated and side bent right Sacrum right on right    Impression and Recommendations:     This case required medical decision making of moderate complexity. The above documentation has been reviewed and is accurate and complete Kayla Jackson , DO       Note: This dictation was prepared with Dragon dictation along with smaller phrase technology. Any transcriptional errors that result from this process are unintentional.

## 2018-09-29 NOTE — Assessment & Plan Note (Signed)
Decision today to treat with OMT was based on Physical Exam  After verbal consent patient was treated with HVLA, ME, FPR techniques in  thoracic, lumbar and sacral areas  Patient tolerated the procedure well with improvement in symptoms  Patient given exercises, stretches and lifestyle modifications  See medications in patient instructions if given  Patient will follow up in 5-6 weeks 

## 2018-11-05 ENCOUNTER — Encounter: Payer: Self-pay | Admitting: Family Medicine

## 2018-11-05 ENCOUNTER — Other Ambulatory Visit: Payer: Self-pay

## 2018-11-05 ENCOUNTER — Ambulatory Visit (INDEPENDENT_AMBULATORY_CARE_PROVIDER_SITE_OTHER): Payer: 59 | Admitting: Family Medicine

## 2018-11-05 VITALS — BP 110/72 | HR 68 | Ht 65.0 in | Wt 164.4 lb

## 2018-11-05 DIAGNOSIS — M533 Sacrococcygeal disorders, not elsewhere classified: Secondary | ICD-10-CM | POA: Diagnosis not present

## 2018-11-05 DIAGNOSIS — M999 Biomechanical lesion, unspecified: Secondary | ICD-10-CM | POA: Diagnosis not present

## 2018-11-05 NOTE — Progress Notes (Signed)
Tawana ScaleZach Bebe Moncure D.O. [facility] [address] [place], [place] [address] Phone: [phone] Subjective:    Kayla EmoryI, Molly Weber, LAT, ATC, am serving as scribe for Dr. Antoine PrimasZachary Kajuana Shareef.  CC:  Back pain follow up   OZH:[id number]:Subjective    09/29/18: Patient doing relatively well.  We discussed icing regimen and home exercises.  We discussed again about anti-inflammatories when having a flare.  We discussed core strengthening.  Has been doing very well with bike riding.  Follow-up again in 5 to 6 weeks  11/05/18 Kayla Jackson is a 48 y.o. female coming in with complaint of R hip and low back pain.  She was last seen on 09/29/18 and notes that her symptoms are about the same.  The majority of her pain is in her R SIJ today.  Pt states that she's walking 3-4 miles several times a week and riding her [employer] bike.  She con't her HEP and has been using the IBU bursts when she has flares in her pain.     No past medical history on file. Past Surgical History:  Procedure Laterality Date  . BREAST CYST ASPIRATION Left    calcified milk duct  . CESAREAN SECTION     Social History   Socioeconomic History  . Marital status: Married    Spouse name: Not on file  . Number of children: Not on file  . Years of education: Not on file  . Highest education level: Not on file  Occupational History  . Not on file  Social Needs  . Financial resource strain: Not on file  . Food insecurity    Worry: Not on file    Inability: Not on file  . Transportation needs    Medical: Not on file    Non-medical: Not on file  Tobacco Use  . Smoking status: Never Smoker  . Smokeless tobacco: Never Used  Substance and Sexual Activity  . Alcohol use: Yes    Comment: occ  . Drug use: No  . Sexual activity: Not on file  Lifestyle  . Physical activity    Days per week: Not on file    Minutes per session: Not on file  . Stress: Not on file  Relationships  . Social Musicianconnections    Talks on phone: Not on file   Gets together: Not on file    Attends religious service: Not on file    Active member of club or organization: Not on file    Attends meetings of clubs or organizations: Not on file    Relationship status: Not on file  Other Topics Concern  . Not on file  Social History Narrative  . Not on file   No Known Allergies No family history on file.     Current Outpatient Medications (Analgesics):  .  ibuprofen (ADVIL) 600 MG tablet, Take 600 mg by mouth daily.      Past medical history, social, surgical and family history all reviewed in electronic medical record.  No pertanent information unless stated regarding to the chief complaint.   Review of Systems:  No headache, visual changes, nausea, vomiting, diarrhea, constipation, dizziness, abdominal pain, skin rash, fevers, chills, night sweats, weight loss, swollen lymph nodes, body aches, joint swelling,  chest pain, shortness of breath, mood changes. Positive muscle aches.   Objective  Blood pressure 110/72, pulse 68, height 5\' 5"  (1.651 m), weight 164 lb 6.4 oz (74.6 kg), SpO2 98 %.    General: No apparent distress alert  and oriented x3 mood and affect normal, dressed appropriately.  HEENT: Pupils equal, extraocular movements intact  Respiratory: Patient's speak in full sentences and does not appear short of breath  Cardiovascular: No lower extremity edema, non tender, no erythema  Skin: Warm dry intact with no signs of infection or rash on extremities or on axial skeleton.  Abdomen: Soft nontender  Neuro: Cranial nerves II through XII are intact, neurovascularly intact in all extremities with 2+ DTRs and 2+ pulses.  Lymph: No lymphadenopathy of posterior or anterior cervical chain or axillae bilaterally.  Gait antalgic  MSK:  tender with limited range of motion and good stability and symmetric strength and tone of shoulders, elbows, wrist, hip, knee and ankles bilaterally.  Back Exam:  Inspection: loss of lordosis  Motion:  Flexion 40 deg, Extension 35 deg, Side Bending to 25 deg bilaterally,  Rotation to 35 deg bilaterally  SLR laying: Negative  XSLR laying: Negative  Palpable tenderness: Tender to palpation. FABER: Tightness. Sensory change: Gross sensation intact to all lumbar and sacral dermatomes.  Reflexes: 2+ at both patellar tendons, 2+ at achilles tendons, Babinski's downgoing.  Strength at foot  Plantar-flexion: 5/5 Dorsi-flexion: 5/5 Eversion: 5/5 Inversion: 5/5  Leg strength  Quad: 5/5 Hamstring: 5/5 Hip flexor: 5/5 Hip abductors: 5/5   Osteopathic findings  T7 extended rotated and side bent left L2 flexed rotated and side bent right Sacrum right on right     Impression and Recommendations:     This case required medical decision making of moderate complexity. The above documentation has been reviewed and is accurate and complete Lyndal Pulley, DO       Note: This dictation was prepared with Dragon dictation along with smaller phrase technology. Any transcriptional errors that result from this process are unintentional.

## 2018-11-05 NOTE — Patient Instructions (Signed)
[place] or [place] for walking Also [employer] [address] or [place] [address]  Keep doing exercises  See me again in 4-5 weeks

## 2018-11-05 NOTE — Assessment & Plan Note (Signed)
Patient doing relatively well.  Continues to have tightness.  We discussed we could do advanced imaging but I do not think it would change medical management.  Discussed posture and ergonomics.  Patient will follow-up with me again in 4 to 8 weeks

## 2018-11-05 NOTE — Assessment & Plan Note (Signed)
Decision today to treat with OMT was based on Physical Exam  After verbal consent patient was treated with HVLA, ME, FPR techniques in  thoracic, lumbar and sacral areas  Patient tolerated the procedure well with improvement in symptoms  Patient given exercises, stretches and lifestyle modifications  See medications in patient instructions if given  Patient will follow up in 4-8 weeks 

## 2018-11-10 ENCOUNTER — Ambulatory Visit: Payer: 59 | Admitting: Family Medicine

## 2018-12-03 ENCOUNTER — Encounter: Payer: Self-pay | Admitting: Family Medicine

## 2018-12-03 ENCOUNTER — Other Ambulatory Visit: Payer: Self-pay

## 2018-12-03 ENCOUNTER — Ambulatory Visit (INDEPENDENT_AMBULATORY_CARE_PROVIDER_SITE_OTHER): Payer: 59 | Admitting: Family Medicine

## 2018-12-03 DIAGNOSIS — M999 Biomechanical lesion, unspecified: Secondary | ICD-10-CM

## 2018-12-03 DIAGNOSIS — M533 Sacrococcygeal disorders, not elsewhere classified: Secondary | ICD-10-CM | POA: Diagnosis not present

## 2018-12-03 NOTE — Assessment & Plan Note (Signed)
Stable overall.  Discussed posture and ergonomics.  Discussed which activities of doing which wants to avoid.  Patient is responded fairly well to osteopathic manipulation.  Follow-up again in 4 to 8 weeks.

## 2018-12-03 NOTE — Patient Instructions (Signed)
Good luck with the dog Keep up everything [place] on the knee See me again in 6-8 weeks

## 2018-12-03 NOTE — Progress Notes (Signed)
[name] [place], [place] [address] Phone: [phone] Subjective:     CC: Low back and neck pain follow-up  XLK:[id number]  Kayla Jackson is a 48 y.o. female coming in with complaint of back and neck pain follow-up.  Patient has had difficulty over the course of time.  Patient has been making some improvement.  Not having as much impingement of the hip.  Still having some unfortunately discomfort from time to time.      No past medical history on file. Past Surgical History:  Procedure Laterality Date  . BREAST CYST ASPIRATION Left    calcified milk duct  . CESAREAN SECTION     Social History   Socioeconomic History  . Marital status: Married    Spouse name: Not on file  . Number of children: Not on file  . Years of education: Not on file  . Highest education level: Not on file  Occupational History  . Not on file  Social Needs  . Financial resource strain: Not on file  . Food insecurity    Worry: Not on file    Inability: Not on file  . Transportation needs    Medical: Not on file    Non-medical: Not on file  Tobacco Use  . Smoking status: Never Smoker  . Smokeless tobacco: Never Used  Substance and Sexual Activity  . Alcohol use: Yes    Comment: occ  . Drug use: No  . Sexual activity: Not on file  Lifestyle  . Physical activity    Days per week: Not on file    Minutes per session: Not on file  . Stress: Not on file  Relationships  . Social Herbalist on phone: Not on file    Gets together: Not on file    Attends religious service: Not on file    Active member of club or organization: Not on file    Attends meetings of clubs or organizations: Not on file    Relationship status: Not on file  Other Topics Concern  . Not on file  Social History Narrative  . Not on file   No Known Allergies No family history on file.     Current Outpatient Medications (Analgesics):  .  ibuprofen (ADVIL)  600 MG tablet, Take 600 mg by mouth daily.      Past medical history, social, surgical and family history all reviewed in electronic medical record.  No pertanent information unless stated regarding to the chief complaint.   Review of Systems:  No headache, visual changes, nausea, vomiting, diarrhea, constipation, dizziness, abdominal pain, skin rash, fevers, chills, night sweats, weight loss, swollen lymph nodes, body aches, joint swelling, muscle aches, chest pain, shortness of breath, mood changes.  Positive muscle aches  Objective  f    General: No apparent distress alert and oriented x3 mood and affect normal, dressed appropriately.  HEENT: Pupils equal, extraocular movements intact  Respiratory: Patient's speak in full sentences and does not appear short of breath  Cardiovascular: No lower extremity edema, non tender, no erythema  Skin: Warm dry intact with no signs of infection or rash on extremities or on axial skeleton.  Abdomen: Soft nontender  Neuro: Cranial nerves II through XII are intact, neurovascularly intact in all extremities with 2+ DTRs and 2+ pulses.  Lymph: No lymphadenopathy of posterior or anterior cervical chain or axillae bilaterally.  Gait normal with good balance and coordination.  MSK:  Non tender with full range of motion and good stability and symmetric strength and tone of shoulders, elbows, wrist, hip, knee and ankles bilaterally.  Neck exam has mild loss of lordosis.  Low back pain still noted with paraspinal musculature tenderness mostly around L5-S1 right greater than left.  Positive right Faber test.  Negative straight leg test.  Neurovascular intact distally.  5 out of 5 strength  Osteopathic findings  T9 extended rotated and side bent left L2 flexed rotated and side bent right Sacrum right on right    Impression and Recommendations:     This case required medical decision making of moderate complexity. The above documentation has been  reviewed and is accurate and complete Judi SaaZachary M , DO       Note: This dictation was prepared with Dragon dictation along with smaller phrase technology. Any transcriptional errors that result from this process are unintentional.

## 2018-12-03 NOTE — Assessment & Plan Note (Signed)
Decision today to treat with OMT was based on Physical Exam  After verbal consent patient was treated with HVLA, ME, FPR techniques in  thoracic, lumbar and sacral areas  Patient tolerated the procedure well with improvement in symptoms  Patient given exercises, stretches and lifestyle modifications  See medications in patient instructions if given 4 Patient will follow up in-4-8  weeks

## 2019-01-18 NOTE — Progress Notes (Signed)
[facility] [address], [place] [address] Phone: [phone] Subjective:     CC: Low back pain follow-up  OEV:[id number]    12/03/18: Stable overall.  Discussed posture and ergonomics.  Discussed which activities of doing which wants to avoid.  Patient is responded fairly well to osteopathic manipulation.  Follow-up again in 4 to 8 weeks  Update- 01/19/19: Kayla Jackson is a 48 y.o. female coming in with complaint of back pain. Pain has increased since last visit as she has started doing a HIIT workout with her son. Has since discontinue workout. Denies any radicular symptoms.  Pain moves from right and left SI joint. Overall is feeling better today after discontinuing workouts. Will return to riding her Peloton later this week.      No past medical history on file. Past Surgical History:  Procedure Laterality Date  . BREAST CYST ASPIRATION Left    calcified milk duct  . CESAREAN SECTION     Social History   Socioeconomic History  . Marital status: Married    Spouse name: Not on file  . Number of children: Not on file  . Years of education: Not on file  . Highest education level: Not on file  Occupational History  . Not on file  Social Needs  . Financial resource strain: Not on file  . Food insecurity    Worry: Not on file    Inability: Not on file  . Transportation needs    Medical: Not on file    Non-medical: Not on file  Tobacco Use  . Smoking status: Never Smoker  . Smokeless tobacco: Never Used  Substance and Sexual Activity  . Alcohol use: Yes    Comment: occ  . Drug use: No  . Sexual activity: Not on file  Lifestyle  . Physical activity    Days per week: Not on file    Minutes per session: Not on file  . Stress: Not on file  Relationships  . Social Herbalist on phone: Not on file    Gets together: Not on file    Attends religious service: Not on file    Active member of club or organization: Not  on file    Attends meetings of clubs or organizations: Not on file    Relationship status: Not on file  Other Topics Concern  . Not on file  Social History Narrative  . Not on file   No Known Allergies No family history on file.     Current Outpatient Medications (Analgesics):  .  ibuprofen (ADVIL) 600 MG tablet, Take 600 mg by mouth daily.      Past medical history, social, surgical and family history all reviewed in electronic medical record.  No pertanent information unless stated regarding to the chief complaint.   Review of Systems:  No headache, visual changes, nausea, vomiting, diarrhea, constipation, dizziness, abdominal pain, skin rash, fevers, chills, night sweats, weight loss, swollen lymph nodes, body aches, joint swelling, chest pain, shortness of breath, mood changes.  Positive muscle aches  Objective  Blood pressure 110/70, pulse 84, SpO2 98 %.    General: No apparent distress alert and oriented x3 mood and affect normal, dressed appropriately.  HEENT: Pupils equal, extraocular movements intact  Respiratory: Patient's speak in full sentences and does not appear short of breath  Cardiovascular: No lower extremity edema, non tender, no erythema  Skin: Warm dry intact with no signs of infection or  rash on extremities or on axial skeleton.  Abdomen: Soft nontender  Neuro: Cranial nerves II through XII are intact, neurovascularly intact in all extremities with 2+ DTRs and 2+ pulses.  Lymph: No lymphadenopathy of posterior or anterior cervical chain or axillae bilaterally.  Gait normal with good balance and coordination.  MSK:  Non tender with full range of motion and good stability and symmetric strength and tone of shoulders, elbows, wrist, hip, knee and ankles bilaterally.  Back Exam:  Inspection: Loss of lordosis Motion: Flexion 45 deg, Extension 25 deg, Side Bending to 35 deg bilaterally,  Rotation to 45 deg bilaterally  SLR laying: Negative  XSLR laying:  Negative  Palpable tenderness: Tender to palpation in the paraspinal musculature lumbar spine.  Left greater than right. FABER: Positive left. Sensory change: Gross sensation intact to all lumbar and sacral dermatomes.  Reflexes: 2+ at both patellar tendons, 2+ at achilles tendons, Babinski's downgoing.  Strength at foot  Plantar-flexion: 5/5 Dorsi-flexion: 5/5 Eversion: 5/5 Inversion: 5/5  Leg strength  Quad: 5/5 Hamstring: 5/5 Hip flexor: 5/5 Hip abductors: 5/5  Gait unremarkable.  Osteopathic findings  T8 extended rotated and side bent left L2 flexed rotated and side bent right Sacrum left on left    Impression and Recommendations:     This case required medical decision making of moderate complexity. The above documentation has been reviewed and is accurate and complete [name], DO       Note: This dictation was prepared with Dragon dictation along with smaller phrase technology. Any transcriptional errors that result from this process are unintentional.

## 2019-01-19 ENCOUNTER — Other Ambulatory Visit: Payer: Self-pay

## 2019-01-19 ENCOUNTER — Ambulatory Visit (INDEPENDENT_AMBULATORY_CARE_PROVIDER_SITE_OTHER): Payer: 59 | Admitting: Family Medicine

## 2019-01-19 ENCOUNTER — Encounter: Payer: Self-pay | Admitting: Family Medicine

## 2019-01-19 VITALS — BP 110/70 | HR 84

## 2019-01-19 DIAGNOSIS — M533 Sacrococcygeal disorders, not elsewhere classified: Secondary | ICD-10-CM

## 2019-01-19 DIAGNOSIS — M999 Biomechanical lesion, unspecified: Secondary | ICD-10-CM

## 2019-01-19 NOTE — Assessment & Plan Note (Signed)
Decision today to treat with OMT was based on Physical Exam  After verbal consent patient was treated with HVLA, ME, FPR techniques in  thoracic, lumbar and sacral areas  Patient tolerated the procedure well with improvement in symptoms  Patient given exercises, stretches and lifestyle modifications  See medications in patient instructions if given  Patient will follow up in  6-8 weeks 

## 2019-01-19 NOTE — Patient Instructions (Signed)
Try pennsaid on back when needed Keep doing exercises  See me in 6-7 weeks

## 2019-01-19 NOTE — Assessment & Plan Note (Signed)
Discussed which activities to do away from his avoid.  Patient is to increase more of the core strengthening.  Discussed the possibility of a topical anti-inflammatories.  Responding well to manipulation and follow-up again in 6 weeks.

## 2019-03-02 ENCOUNTER — Ambulatory Visit (INDEPENDENT_AMBULATORY_CARE_PROVIDER_SITE_OTHER): Payer: 59 | Admitting: Family Medicine

## 2019-03-02 ENCOUNTER — Encounter: Payer: Self-pay | Admitting: Family Medicine

## 2019-03-02 VITALS — BP 118/72 | HR 74 | Ht 65.0 in | Wt 164.0 lb

## 2019-03-02 DIAGNOSIS — M999 Biomechanical lesion, unspecified: Secondary | ICD-10-CM

## 2019-03-02 DIAGNOSIS — M533 Sacrococcygeal disorders, not elsewhere classified: Secondary | ICD-10-CM | POA: Diagnosis not present

## 2019-03-02 MED ORDER — METHYLPREDNISOLONE ACETATE 80 MG/ML IJ SUSP
80.0000 mg | Freq: Once | INTRAMUSCULAR | Status: AC
  Administered 2019-03-02: 80 mg via INTRAMUSCULAR

## 2019-03-02 MED ORDER — KETOROLAC TROMETHAMINE 60 MG/2ML IM SOLN
60.0000 mg | Freq: Once | INTRAMUSCULAR | Status: AC
  Administered 2019-03-02: 60 mg via INTRAMUSCULAR

## 2019-03-02 MED ORDER — PREDNISONE 50 MG PO TABS: 50.0000 mg | ORAL_TABLET | Freq: Every day | ORAL | 0 refills | Status: DC

## 2019-03-02 MED ORDER — GABAPENTIN 100 MG PO CAPS: 200.0000 mg | ORAL_CAPSULE | Freq: Every day | ORAL | 3 refills | Status: AC

## 2019-03-02 NOTE — Patient Instructions (Signed)
Good to see you  Ice is your friend every 4 hours pennsaid pinkie amount topically 2 times daily as needed.  2 injections today  Prednisone daily starting tomorrow  Gabapentin 200mg  at night  See em again in 10-14 days

## 2019-03-02 NOTE — Assessment & Plan Note (Signed)
Severe exacerbation.  Discussed with patient in great length, we discussed home exercises and icing regimen, which activities to doing which wants to avoid.  Prednisone given, gabapentin given, Toradol and Depo-Medrol given today to help.  Patient will follow up with me again 2 weeks

## 2019-03-02 NOTE — Progress Notes (Signed)
[name] [address] [place], [place] [address] Phone: [phone] Subjective:   Bruce Donath, am serving as a scribe for Dr. Antoine Primas.    CC: Right-sided low back pain  LEX:[id number]  Kayla Jackson is a 48 y.o. female coming in with complaint of left SI joint pain. Patient states that she went for a long hike 2 weeks ago.  Patient states incident pain seems to be worsening again.  Patient states this is localized, no radiation of pain down the legs but states that the pain is 9 out of 10.  Not responding to any of the medications she has been prescribed previously     No past medical history on file. Past Surgical History:  Procedure Laterality Date  . BREAST CYST ASPIRATION Left    calcified milk duct  . CESAREAN SECTION     Social History   Socioeconomic History  . Marital status: Married    Spouse name: Not on file  . Number of children: Not on file  . Years of education: Not on file  . Highest education level: Not on file  Occupational History  . Not on file  Social Needs  . Financial resource strain: Not on file  . Food insecurity    Worry: Not on file    Inability: Not on file  . Transportation needs    Medical: Not on file    Non-medical: Not on file  Tobacco Use  . Smoking status: Never Smoker  . Smokeless tobacco: Never Used  Substance and Sexual Activity  . Alcohol use: Yes    Comment: occ  . Drug use: No  . Sexual activity: Not on file  Lifestyle  . Physical activity    Days per week: Not on file    Minutes per session: Not on file  . Stress: Not on file  Relationships  . Social Musician on phone: Not on file    Gets together: Not on file    Attends religious service: Not on file    Active member of club or organization: Not on file    Attends meetings of clubs or organizations: Not on file    Relationship status: Not on file  Other Topics Concern  . Not on file  Social History  Narrative  . Not on file   No Known Allergies no known drug allergies No family history on file.  No family history of autoimmune  Current Outpatient Medications (Endocrine & Metabolic):  .  predniSONE (DELTASONE) 50 MG tablet, Take 1 tablet (50 mg total) by mouth daily.    Current Outpatient Medications (Analgesics):  .  ibuprofen (ADVIL) 600 MG tablet, Take 600 mg by mouth daily.   Current Outpatient Medications (Other):  .  gabapentin (NEURONTIN) 100 MG capsule, Take 2 capsules (200 mg total) by mouth at bedtime.    Past medical history, social, surgical and family history all reviewed in electronic medical record.  No pertanent information unless stated regarding to the chief complaint.   Review of Systems:  No headache, visual changes, nausea, vomiting, diarrhea, constipation, dizziness, abdominal pain, skin rash, fevers, chills, night sweats, weight loss, swollen lymph nodes, body aches, joint swelling, chest pain, shortness of breath, mood changes.  Positive muscle aches  Objective  Blood pressure 118/72, pulse 74, height 5\' 5"  (1.651 m), weight 164 lb (74.4 kg), SpO2 95 %.    General: No apparent distress alert and oriented x3 mood and  affect normal, dressed appropriately.  HEENT: Pupils equal, extraocular movements intact  Respiratory: Patient's speak in full sentences and does not appear short of breath  Cardiovascular: No lower extremity edema, non tender, no erythema  Skin: Warm dry intact with no signs of infection or rash on extremities or on axial skeleton.  Abdomen: Soft nontender  Neuro: Cranial nerves II through XII are intact, neurovascularly intact in all extremities with 2+ DTRs and 2+ pulses.  Lymph: No lymphadenopathy of posterior or anterior cervical chain or axillae bilaterally.  Gait normal with good balance and coordination.  Very cautious MSK:  Non tender with full range of motion and good stability and symmetric strength and tone of shoulders,  elbows, wrist, hip, knee and ankles bilaterally.  Back exam does have some tender to palpation over the left sacroiliac joint more than previous.  Possible mild swelling.  Negative straight leg test.  Unable to do Corky Sox secondary to pain     Impression and Recommendations:     This case required medical decision making of moderate complexity. The above documentation has been reviewed and is accurate and complete Lyndal Pulley, DO       Note: This dictation was prepared with Dragon dictation along with smaller phrase technology. Any transcriptional errors that result from this process are unintentional.

## 2019-03-10 ENCOUNTER — Other Ambulatory Visit: Payer: Self-pay

## 2019-03-10 ENCOUNTER — Ambulatory Visit (INDEPENDENT_AMBULATORY_CARE_PROVIDER_SITE_OTHER): Payer: 59 | Admitting: Family Medicine

## 2019-03-10 ENCOUNTER — Encounter: Payer: Self-pay | Admitting: Family Medicine

## 2019-03-10 VITALS — BP 90/70 | HR 80 | Ht 65.0 in | Wt 174.0 lb

## 2019-03-10 DIAGNOSIS — M999 Biomechanical lesion, unspecified: Secondary | ICD-10-CM | POA: Diagnosis not present

## 2019-03-10 DIAGNOSIS — M533 Sacrococcygeal disorders, not elsewhere classified: Secondary | ICD-10-CM

## 2019-03-10 NOTE — Assessment & Plan Note (Signed)
Decision today to treat with OMT was based on Physical Exam  After verbal consent patient was treated with HVLA, ME, FPR techniques in  thoracic, lumbar and sacral areas  Patient tolerated the procedure well with improvement in symptoms  Patient given exercises, stretches and lifestyle modifications  See medications in patient instructions if given  Patient will follow up in 4-8 [place] weeks

## 2019-03-10 NOTE — Assessment & Plan Note (Signed)
Patient does have significant pain.  Positive Corky Sox.  Negative straight leg test.  Patient has responded better to manipulation and did have more improvement today.  Discussed which activities to do which wants to avoid.  Follow-up again 4 to 6 weeks

## 2019-03-10 NOTE — Progress Notes (Signed)
[facility] [address] [place], [place] [address] Phone: [phone] Subjective:   I Kayla Jackson am serving as a Neurosurgeon for Dr. Antoine Primas.   This visit occurred during the SARS-CoV-2 public health emergency.  Safety protocols were in place, including screening questions prior to the visit, additional usage of staff PPE, and extensive cleaning of exam room while observing appropriate contact time as indicated for disinfecting solutions.     CC: right hip pain   JKK:[id number]   03/02/2019 Severe exacerbation.  Discussed with patient in great length, we discussed home exercises and icing regimen, which activities to doing which wants to avoid.  Prednisone given, gabapentin given, Toradol and Depo-Medrol given today to help.  Patient will follow up with me again 2 weeks  Update 03/10/2019 Kayla Jackson is a 48 y.o. female coming in with complaint of back pain. Patient states she is doing better.  Patient continues to have significant discomfort and pain overall.  States that it has improved since the previous exam and feels that the injections were very helpful.     No past medical history on file. Past Surgical History:  Procedure Laterality Date  . BREAST CYST ASPIRATION Left    calcified milk duct  . CESAREAN SECTION     Social History   Socioeconomic History  . Marital status: Married    Spouse name: Not on file  . Number of children: Not on file  . Years of education: Not on file  . Highest education level: Not on file  Occupational History  . Not on file  Social Needs  . Financial resource strain: Not on file  . Food insecurity    Worry: Not on file    Inability: Not on file  . Transportation needs    Medical: Not on file    Non-medical: Not on file  Tobacco Use  . Smoking status: Never Smoker  . Smokeless tobacco: Never Used  Substance and Sexual Activity  . Alcohol use: Yes    Comment: occ  . Drug use: No  . Sexual  activity: Not on file  Lifestyle  . Physical activity    Days per week: Not on file    Minutes per session: Not on file  . Stress: Not on file  Relationships  . Social Musician on phone: Not on file    Gets together: Not on file    Attends religious service: Not on file    Active member of club or organization: Not on file    Attends meetings of clubs or organizations: Not on file    Relationship status: Not on file  Other Topics Concern  . Not on file  Social History Narrative  . Not on file   No Known Allergies No family history on file.  Current Outpatient Medications (Endocrine & Metabolic):  .  predniSONE (DELTASONE) 50 MG tablet, Take 1 tablet (50 mg total) by mouth daily.    Current Outpatient Medications (Analgesics):  .  ibuprofen (ADVIL) 600 MG tablet, Take 600 mg by mouth daily.   Current Outpatient Medications (Other):  .  gabapentin (NEURONTIN) 100 MG capsule, Take 2 capsules (200 mg total) by mouth at bedtime.    Past medical history, social, surgical and family history all reviewed in electronic medical record.  No pertanent information unless stated regarding to the chief complaint.   Review of Systems:  No headache, visual changes, nausea, vomiting, diarrhea, constipation, dizziness, abdominal  pain, skin rash, fevers, chills, night sweats, weight loss, swollen lymph nodes, body aches, joint swelling, muscle aches, chest pain, shortness of breath, mood changes.   Objective  There were no vitals taken for this visit. Systems examined below as of    General: No apparent distress alert and oriented x3 mood and affect normal, dressed appropriately.  HEENT: Pupils equal, extraocular movements intact  Respiratory: Patient's speak in full sentences and does not appear short of breath  Cardiovascular: No lower extremity edema, non tender, no erythema  Skin: Warm dry intact with no signs of infection or rash on extremities or on axial skeleton.   Abdomen: Soft nontender  Neuro: Cranial nerves II through XII are intact, neurovascularly intact in all extremities with 2+ DTRs and 2+ pulses.  Lymph: No lymphadenopathy of posterior or anterior cervical chain or axillae bilaterally.  Gait normal with good balance and coordination.  MSK:  Non tender with full range of motion and good stability and symmetric strength and tone of shoulders, elbows, wrist, knee and ankles bilaterally.  Right hip exam shows the patient does have pain over the right sacroiliac joint.  Mild positive FABER test.  Negative straight leg test.  Full internal range of motion.  Tender to palpation the paraspinal musculature.  .Osteopathic findings   T5 extended rotated and side bent left L2 flexed rotated and side bent right Sacrum right on right    Impression and Recommendations:     This case required medical decision making of moderate complexity. The above documentation has been reviewed and is accurate and complete Lyndal Pulley, DO       Note: This dictation was prepared with Dragon dictation along with smaller phrase technology. Any transcriptional errors that result from this process are unintentional.

## 2019-03-10 NOTE — Patient Instructions (Signed)
[place] for now Hike next weekend See me again in 3 weeks

## 2019-03-29 ENCOUNTER — Encounter: Payer: Self-pay | Admitting: *Deleted

## 2019-03-31 ENCOUNTER — Ambulatory Visit (INDEPENDENT_AMBULATORY_CARE_PROVIDER_SITE_OTHER): Payer: 59 | Admitting: Family Medicine

## 2019-03-31 ENCOUNTER — Encounter: Payer: Self-pay | Admitting: Family Medicine

## 2019-03-31 VITALS — BP 124/72 | HR 80 | Ht 65.0 in

## 2019-03-31 DIAGNOSIS — M999 Biomechanical lesion, unspecified: Secondary | ICD-10-CM

## 2019-03-31 DIAGNOSIS — M533 Sacrococcygeal disorders, not elsewhere classified: Secondary | ICD-10-CM | POA: Diagnosis not present

## 2019-03-31 NOTE — Assessment & Plan Note (Signed)
Decision today to treat with OMT was based on Physical Exam  After verbal consent patient was treated with HVLA, ME, FPR techniques in , thoracic, lumbar and sacral areas  Patient tolerated the procedure well with improvement in symptoms  Patient given exercises, stretches and lifestyle modifications  See medications in patient instructions if given  Patient will follow up in 4-8 weeks 

## 2019-03-31 NOTE — Assessment & Plan Note (Signed)
Patient doing significantly better than previous exam.  Discussed retractors to do x-rays to avoid.  Discussed icing regimen and home exercises.  Patient is to increase activity as tolerated.  Follow-up with me again as leg aches.

## 2019-03-31 NOTE — Patient Instructions (Addendum)
  [address] [place], [place] [address] Phone [phone]  See me again in 5-7 weeks

## 2019-03-31 NOTE — Progress Notes (Signed)
[name] [address] [place], [place] [address] Phone: [phone] Subjective:    I'm seeing this patient by the request  of:    CC: Low back pain follow-up  JKK:[id number]  Kayla Jackson is a 48 y.o. female coming in with complaint of back pain. Last seen for OMT on 03/10/2019. Patient states that she has been doing well since last visit. No new issues.  Patient initially was found to have some arthritic changes.  Patient did have more of a radicular symptoms as well.  Patient is doing significantly better and is near her baseline still some mild aches and pains.  Has not been quite as active as usual either.    No past medical history on file. Past Surgical History:  Procedure Laterality Date  . BREAST CYST ASPIRATION Left    calcified milk duct  . CESAREAN SECTION     Social History   Socioeconomic History  . Marital status: Married    Spouse name: Not on file  . Number of children: Not on file  . Years of education: Not on file  . Highest education level: Not on file  Occupational History  . Not on file  Tobacco Use  . Smoking status: Never Smoker  . Smokeless tobacco: Never Used  Substance and Sexual Activity  . Alcohol use: Yes    Comment: occ  . Drug use: No  . Sexual activity: Not on file  Other Topics Concern  . Not on file  Social History Narrative  . Not on file   Social Determinants of Health   Financial Resource Strain:   . Difficulty of Paying Living Expenses: Not on file  Food Insecurity:   . Worried About Programme researcher, broadcasting/film/video in the Last Year: Not on file  . Ran Out of Food in the Last Year: Not on file  Transportation Needs:   . Lack of Transportation (Medical): Not on file  . Lack of Transportation (Non-Medical): Not on file  Physical Activity:   . Days of Exercise per Week: Not on file  . Minutes of Exercise per Session: Not on file  Stress:   . Feeling of Stress : Not on file  Social Connections:   .  Frequency of Communication with Friends and Family: Not on file  . Frequency of Social Gatherings with Friends and Family: Not on file  . Attends Religious Services: Not on file  . Active Member of Clubs or Organizations: Not on file  . Attends Banker Meetings: Not on file  . Marital Status: Not on file   No Known Allergies No family history on file.  Current Outpatient Medications (Endocrine & Metabolic):  .  predniSONE (DELTASONE) 50 MG tablet, Take 1 tablet (50 mg total) by mouth daily.    Current Outpatient Medications (Analgesics):  .  ibuprofen (ADVIL) 600 MG tablet, Take 600 mg by mouth daily.   Current Outpatient Medications (Other):  .  gabapentin (NEURONTIN) 100 MG capsule, Take 2 capsules (200 mg total) by mouth at bedtime.    Past medical history, social, surgical and family history all reviewed in electronic medical record.  No pertanent information unless stated regarding to the chief complaint.   Review of Systems:  No headache, visual changes, nausea, vomiting, diarrhea, constipation, dizziness, abdominal pain, skin rash, fevers, chills, night sweats, weight loss, swollen lymph nodes, body aches, joint swelling, muscle aches, chest pain, shortness of breath, mood changes.   Objective  Blood  pressure 124/72, pulse 80, height 5\' 5"  (1.651 m), SpO2 98 %.    General: No apparent distress alert and oriented x3 mood and affect normal, dressed appropriately.  HEENT: Pupils equal, extraocular movements intact  Respiratory: Patient's speak in full sentences and does not appear short of breath  Cardiovascular: No lower extremity edema, non tender, no erythema  Skin: Warm dry intact with no signs of infection or rash on extremities or on axial skeleton.  Abdomen: Soft nontender  Neuro: Cranial nerves II through XII are intact, neurovascularly intact in all extremities with 2+ DTRs and 2+ pulses.  Lymph: No lymphadenopathy of posterior or anterior cervical  chain or axillae bilaterally.  Gait normal with good balance and coordination.  MSK:  Non tender with full range of motion and good stability and symmetric strength and tone of shoulders, elbows, wrist, hip, knee and ankles bilaterally.  Back exam does have some loss of lordosis.  Patient does have tightness of the hamstring bilaterally.  FABER test positive on the right.  Deep tendon reflexes intact.  5 out of 5 strength in lower extremity.  Lacks last 5 degrees of extension.  Osteopathic findings  T8 extended rotated and side bent left L2 flexed rotated and side bent right Sacrum right on right     Impression and Recommendations:     This case required medical decision making of moderate complexity. The above documentation has been reviewed and is accurate and complete Kayla Pulley, DO       Note: This dictation was prepared with Dragon dictation along with smaller phrase technology. Any transcriptional errors that result from this process are unintentional.

## 2019-05-13 ENCOUNTER — Encounter: Payer: Self-pay | Admitting: Family Medicine

## 2019-05-13 ENCOUNTER — Ambulatory Visit (INDEPENDENT_AMBULATORY_CARE_PROVIDER_SITE_OTHER): Payer: 59 | Admitting: Family Medicine

## 2019-05-13 ENCOUNTER — Other Ambulatory Visit: Payer: Self-pay

## 2019-05-13 VITALS — BP 110/82 | HR 78 | Ht 65.0 in | Wt 174.0 lb

## 2019-05-13 DIAGNOSIS — M533 Sacrococcygeal disorders, not elsewhere classified: Secondary | ICD-10-CM

## 2019-05-13 DIAGNOSIS — M999 Biomechanical lesion, unspecified: Secondary | ICD-10-CM

## 2019-05-13 NOTE — Assessment & Plan Note (Signed)
Decision today to treat with OMT was based on Physical Exam  After verbal consent patient was treated with HVLA, ME, FPR techniques in  thoracic, lumbar and sacral areas  Patient tolerated the procedure well with improvement in symptoms  Patient given exercises, stretches and lifestyle modifications  See medications in patient instructions if given  Patient will follow up in 4-8 weeks 

## 2019-05-13 NOTE — Assessment & Plan Note (Signed)
Discussed posture and ergonomics, chronic problem stable.  Discussed icing regimen and home exercise, which activities to do.  Discussed ergonomics at work that could be beneficial.  Patient can continue taking gabapentin.  Discussed more other chronic medications.  Follow-up again 4 to 6 weeks

## 2019-05-13 NOTE — Progress Notes (Signed)
[place] Phone: [phone] Subjective:   Fontaine No, am serving as a scribe for Dr. Hulan Saas. This visit occurred during the SARS-CoV-2 public health emergency.  Safety protocols were in place, including screening questions prior to the visit, additional usage of staff PPE, and extensive cleaning of exam room while observing appropriate contact time as indicated for disinfecting solutions.   I'm seeing this patient by the request  of:  Maurice Small, MD  CC: Low back pain follow-up  [id number]     Kayla Jackson is a 49 y.o. female coming in with complaint of back pain. Last seen on 03/31/2019 for OMT. Patient states that she has been having headaches and she does not ever have headaches. Pain began behind both ears and radiated up into skull. Had to miss work last week one day due to pain.  Patient continues to have discomfort on a fairly regular basis at work.  Increasing stress recently.      No past medical history on file. Past Surgical History:  Procedure Laterality Date  . BREAST CYST ASPIRATION Left    calcified milk duct  . CESAREAN SECTION     Social History   Socioeconomic History  . Marital status: Married    Spouse name: Not on file  . Number of children: Not on file  . Years of education: Not on file  . Highest education level: Not on file  Occupational History  . Not on file  Tobacco Use  . Smoking status: Never Smoker  . Smokeless tobacco: Never Used  Substance and Sexual Activity  . Alcohol use: Yes    Comment: occ  . Drug use: No  . Sexual activity: Not on file  Other Topics Concern  . Not on file  Social History Narrative  . Not on file   Social Determinants of Health   Financial Resource Strain:   . Difficulty of Paying Living Expenses: Not on file  Food Insecurity:   . Worried About Charity fundraiser in the Last Year: Not on file  . Ran Out of Food in  the Last Year: Not on file  Transportation Needs:   . Lack of Transportation (Medical): Not on file  . Lack of Transportation (Non-Medical): Not on file  Physical Activity:   . Days of Exercise per Week: Not on file  . Minutes of Exercise per Session: Not on file  Stress:   . Feeling of Stress : Not on file  Social Connections:   . Frequency of Communication with Friends and Family: Not on file  . Frequency of Social Gatherings with Friends and Family: Not on file  . Attends Religious Services: Not on file  . Active Member of Clubs or Organizations: Not on file  . Attends Archivist Meetings: Not on file  . Marital Status: Not on file   No Known Allergies No family history on file.  Current Outpatient Medications (Endocrine & Metabolic):  .  predniSONE (DELTASONE) 50 MG tablet, Take 1 tablet (50 mg total) by mouth daily.    Current Outpatient Medications (Analgesics):  .  ibuprofen (ADVIL) 600 MG tablet, Take 600 mg by mouth daily.   Current Outpatient Medications (Other):  .  gabapentin (NEURONTIN) 100 MG capsule, Take 2 capsules (200 mg total) by mouth at bedtime.   Reviewed prior external information including notes and imaging from  primary care provider As well as notes that were  available from care everywhere and other healthcare systems.  Past medical history, social, surgical and family history all reviewed in electronic medical record.  No pertanent information unless stated regarding to the chief complaint.   Review of Systems:  No headache, visual changes, nausea, vomiting, diarrhea, constipation, dizziness, abdominal pain, skin rash, fevers, chills, night sweats, weight loss, swollen lymph nodes, body aches, joint swelling, chest pain, shortness of breath, mood changes. POSITIVE muscle aches  Objective  Blood pressure 110/82, pulse 78, height 5\' 5"  (1.651 m), weight 174 lb (78.9 kg), SpO2 99 %.   General: No apparent distress alert and oriented x3  mood and affect normal, dressed appropriately.  HEENT: Pupils equal, extraocular movements intact  Respiratory: Patient's speak in full sentences and does not appear short of breath  Cardiovascular: No lower extremity edema, non tender, no erythema  Skin: Warm dry intact with no signs of infection or rash on extremities or on axial skeleton.  Abdomen: Soft nontender  Neuro: Cranial nerves II through XII are intact, neurovascularly intact in all extremities with 2+ DTRs and 2+ pulses.  Lymph: No lymphadenopathy of posterior or anterior cervical chain or axillae bilaterally.  Gait normal with good balance and coordination.  MSK:  Non tender with full range of motion and good stability and symmetric strength and tone of shoulders, elbows, wrist, hip, knee and ankles bilaterally.  Low back exam does have some mild loss of lordosis.  Tenderness to palpation in the paraspinal musculature lumbar spine.  Positive FABER test on the right side.  Negative straight leg test.  Osteopathic findings  T6 extended rotated and side bent left L5 flexed rotated and side bent left Sacrum right on right    Impression and Recommendations:     This case required medical decision making of moderate complexity. The above documentation has been reviewed and is accurate and complete , DO       Note: This dictation was prepared with Dragon dictation along with smaller phrase technology. Any transcriptional errors that result from this process are unintentional.

## 2019-05-13 NOTE — Patient Instructions (Signed)
See me in 6 weeks

## 2019-06-24 ENCOUNTER — Other Ambulatory Visit: Payer: Self-pay

## 2019-06-24 ENCOUNTER — Ambulatory Visit (INDEPENDENT_AMBULATORY_CARE_PROVIDER_SITE_OTHER): Payer: 59 | Admitting: Family Medicine

## 2019-06-24 ENCOUNTER — Encounter: Payer: Self-pay | Admitting: Family Medicine

## 2019-06-24 VITALS — BP 92/70 | HR 72 | Ht 65.0 in | Wt 175.0 lb

## 2019-06-24 DIAGNOSIS — M542 Cervicalgia: Secondary | ICD-10-CM

## 2019-06-24 DIAGNOSIS — M999 Biomechanical lesion, unspecified: Secondary | ICD-10-CM

## 2019-06-24 NOTE — Progress Notes (Signed)
[place] [facility] [address] [place] Phone: [phone] Subjective:   I Kayla Jackson am serving as a Education administrator for Dr. Hulan Saas.  This visit occurred during the SARS-CoV-2 public health emergency.  Safety protocols were in place, including screening questions prior to the visit, additional usage of staff PPE, and extensive cleaning of exam room while observing appropriate contact time as indicated for disinfecting solutions.   I'm seeing this patient by the request  of:  Maurice Small, MD  CC:  Back pain follow up   [id number]  Kayla Jackson is a 49 y.o. female coming in with complaint of back pain. Last seen on 05/13/2019 for OMT. Patient states she has been doing well. Upper trap near the shoulder blade on the left side is painful.      No past medical history on file. Past Surgical History:  Procedure Laterality Date  . BREAST CYST ASPIRATION Left    calcified milk duct  . CESAREAN SECTION     Social History   Socioeconomic History  . Marital status: Married    Spouse name: Not on file  . Number of children: Not on file  . Years of education: Not on file  . Highest education level: Not on file  Occupational History  . Not on file  Tobacco Use  . Smoking status: Never Smoker  . Smokeless tobacco: Never Used  Substance and Sexual Activity  . Alcohol use: Yes    Comment: occ  . Drug use: No  . Sexual activity: Not on file  Other Topics Concern  . Not on file  Social History Narrative  . Not on file   Social Determinants of Health   Financial Resource Strain:   . Difficulty of Paying Living Expenses:   Food Insecurity:   . Worried About Charity fundraiser in the Last Year:   . Arboriculturist in the Last Year:   Transportation Needs:   . Film/video editor (Medical):   Marland Kitchen Lack of Transportation (Non-Medical):   Physical Activity:   . Days of Exercise per Week:   . Minutes of Exercise per Session:     Stress:   . Feeling of Stress :   Social Connections:   . Frequency of Communication with Friends and Family:   . Frequency of Social Gatherings with Friends and Family:   . Attends Religious Services:   . Active Member of Clubs or Organizations:   . Attends Archivist Meetings:   Marland Kitchen Marital Status:    No Known Allergies No family history on file.  Current Outpatient Medications (Endocrine & Metabolic):  .  predniSONE (DELTASONE) 50 MG tablet, Take 1 tablet (50 mg total) by mouth daily.    Current Outpatient Medications (Analgesics):  .  ibuprofen (ADVIL) 600 MG tablet, Take 600 mg by mouth daily.   Current Outpatient Medications (Other):  .  gabapentin (NEURONTIN) 100 MG capsule, Take 2 capsules (200 mg total) by mouth at bedtime.   Reviewed prior external information including notes and imaging from  primary care provider As well as notes that were available from care everywhere and other healthcare systems.  Past medical history, social, surgical and family history all reviewed in electronic medical record.  No pertanent information unless stated regarding to the chief complaint.   Review of Systems:  No headache, visual changes, nausea, vomiting, diarrhea, constipation, dizziness, abdominal pain, skin rash, fevers, chills, night sweats, weight loss, swollen lymph  nodes, body aches, joint swelling, chest pain, shortness of breath, mood changes. POSITIVE muscle aches  Objective  Blood pressure 92/70, pulse 72, height 5\' 5"  (1.651 m), weight 175 lb (79.4 kg), SpO2 98 %.   General: No apparent distress alert and oriented x3 mood and affect normal, dressed appropriately.  HEENT: Pupils equal, extraocular movements intact  Respiratory: Patient's speak in full sentences and does not appear short of breath  Cardiovascular: No lower extremity edema, non tender, no erythema  Skin: Warm dry intact with no signs of infection or rash on extremities or on axial skeleton.   Abdomen: Soft nontender  Neuro: Cranial nerves II through XII are intact, neurovascularly intact in all extremities with 2+ DTRs and 2+ pulses.  Lymph: No lymphadenopathy of posterior or anterior cervical chain or axillae bilaterally.  Gait normal with good balance and coordination.  MSK:  tender with limited range of motion and good stability and symmetric strength and tone of shoulders, elbows, wrist, hip, knee and ankles bilaterally.  Patient's neck does have some mild loss of lordosis.  Tender to palpation in the paraspinal musculature lumbar spine right greater than left.  Patient does have tenderness in these parascapular region left greater than right which is new for patient.  Patient does have near full range of motion in all planes.  Osteopathic findings  C6 flexed rotated and side bent left T5 extended rotated and side bent left inhaled rib T8 extended rotated and side bent left L2 flexed rotated and side bent right Sacrum right on right      Impression and Recommendations:     This case required medical decision making of moderate complexity. The above documentation has been reviewed and is accurate and complete , DO       Note: This dictation was prepared with Dragon dictation along with smaller phrase technology. Any transcriptional errors that result from this process are unintentional.

## 2019-06-24 NOTE — Assessment & Plan Note (Signed)
Decision today to treat with OMT was based on Physical Exam  After verbal consent patient was treated with HVLA, ME, FPR techniques in cervical, thoracic, rib, lumbar and sacral areas avoided HVLA done on the cervical spine  Patient tolerated the procedure well with improvement in symptoms  Patient given exercises, stretches and lifestyle modifications  See medications in patient instructions if given  Patient will follow up in 4-8 weeks

## 2019-06-24 NOTE — Assessment & Plan Note (Signed)
Neck pain patient did have more tightness.  I do think it is ergonomic and how patient is sitting.  Patient is doing a lot more repetitive activity even at work than usual and I think this is contributing.  Discussed posture and ergonomics, home exercise, follow-up again in 4 to 8 weeks

## 2019-06-24 NOTE — Patient Instructions (Addendum)
Good to see you We will try a stool at work maybe Massage gun or yoga wheel  Good luck Saturday  Show the front: OK to schedule her mom anywhere next week or 2   See me again in 4-6 weeks

## 2019-08-04 NOTE — Progress Notes (Signed)
[facility] [place] Phone: [phone] Subjective:    Rito Ehrlich, am serving as a scribe for Dr. Hulan Saas.  I'm seeing this patient by the request  of:  Maurice Small, MD  CC: Low back pain follow-up  [id number]  Kayla Jackson is a 49 y.o. female coming in with complaint of back pain. Last seen on 06/24/2019 for OMT. Patient states back feels "pretty good."  Patient has been doing relatively well overall.  Patient did switch where her job is at this time and feels like it has been more helpful.  Denies any radiation of pain. Patient has since no change in job has been doing better.      History reviewed. No pertinent past medical history. Past Surgical History:  Procedure Laterality Date  . BREAST CYST ASPIRATION Left    calcified milk duct  . CESAREAN SECTION     Social History   Socioeconomic History  . Marital status: Married    Spouse name: Not on file  . Number of children: Not on file  . Years of education: Not on file  . Highest education level: Not on file  Occupational History  . Not on file  Tobacco Use  . Smoking status: Never Smoker  . Smokeless tobacco: Never Used  Substance and Sexual Activity  . Alcohol use: Yes    Comment: occ  . Drug use: No  . Sexual activity: Not on file  Other Topics Concern  . Not on file  Social History Narrative  . Not on file   Social Determinants of Health   Financial Resource Strain:   . Difficulty of Paying Living Expenses:   Food Insecurity:   . Worried About Charity fundraiser in the Last Year:   . Arboriculturist in the Last Year:   Transportation Needs:   . Film/video editor (Medical):   Marland Kitchen Lack of Transportation (Non-Medical):   Physical Activity:   . Days of Exercise per Week:   . Minutes of Exercise per Session:   Stress:   . Feeling of Stress :   Social Connections:   . Frequency of Communication with Friends and Family:    . Frequency of Social Gatherings with Friends and Family:   . Attends Religious Services:   . Active Member of Clubs or Organizations:   . Attends Archivist Meetings:   Marland Kitchen Marital Status:    No Known Allergies no known drug allergies History reviewed. No pertinent family history.     Current Outpatient Medications (Analgesics):  .  ibuprofen (ADVIL) 600 MG tablet, Take 600 mg by mouth daily.   Current Outpatient Medications (Other):  .  gabapentin (NEURONTIN) 100 MG capsule, Take 2 capsules (200 mg total) by mouth at bedtime. (Patient not taking: Reported on 08/05/2019)   Reviewed prior external information including notes and imaging from  primary care provider As well as notes that were available from care everywhere and other healthcare systems.  Past medical history, social, surgical and family history all reviewed in electronic medical record.  No pertanent information unless stated regarding to the chief complaint.   Review of Systems:  No headache, visual changes, nausea, vomiting, diarrhea, constipation, dizziness, abdominal pain, skin rash, fevers, chills, night sweats, weight loss, swollen lymph nodes, body aches, joint swelling, chest pain, shortness of breath, mood changes. POSITIVE muscle aches  Objective  Blood pressure 110/80, pulse 73, height 5\' 5"  (1.651  m), weight 173 lb (78.5 kg), SpO2 99 %.   General: No apparent distress alert and oriented x3 mood and affect normal, dressed appropriately.  HEENT: Pupils equal, extraocular movements intact  Respiratory: Patient's speak in full sentences and does not appear short of breath  Cardiovascular: No lower extremity edema, non tender, no erythema  Neuro: Cranial nerves II through XII are intact, neurovascularly intact in all extremities with 2+ DTRs and 2+ pulses.  Gait normal with good balance and coordination.  MSK:  tender with full range of motion and good stability and symmetric strength and tone of  shoulders, elbows, wrist, hip, knee and ankles bilaterally.  Low back exam does have some tenderness to palpation of paraspinal musculature lumbar spine right greater than left.  Mild positive FABER test. 5-5 strength in lower extremities.  Mild pain in the thoracolumbar juncture right greater than left as well which is new.  Osteopathic findings  C6 flexed rotated and side bent left T4 extended rotated and side bent right inhaled rib T6 extended rotated and side bent left L1 flexed rotated and side bent right Sacrum right on right    Impression and Recommendations:     This case required medical decision making of moderate complexity. The above documentation has been reviewed and is accurate and complete [name], DO       Note: This dictation was prepared with Dragon dictation along with smaller phrase technology. Any transcriptional errors that result from this process are unintentional.

## 2019-08-05 ENCOUNTER — Encounter: Payer: Self-pay | Admitting: Family Medicine

## 2019-08-05 ENCOUNTER — Other Ambulatory Visit: Payer: Self-pay

## 2019-08-05 ENCOUNTER — Ambulatory Visit (INDEPENDENT_AMBULATORY_CARE_PROVIDER_SITE_OTHER): Payer: 59 | Admitting: Family Medicine

## 2019-08-05 VITALS — BP 110/80 | HR 73 | Ht 65.0 in | Wt 173.0 lb

## 2019-08-05 DIAGNOSIS — M999 Biomechanical lesion, unspecified: Secondary | ICD-10-CM | POA: Diagnosis not present

## 2019-08-05 DIAGNOSIS — M533 Sacrococcygeal disorders, not elsewhere classified: Secondary | ICD-10-CM

## 2019-08-05 NOTE — Assessment & Plan Note (Signed)

## 2019-08-05 NOTE — Assessment & Plan Note (Signed)
Chronic problem but stable.  Patient with less repetitive activity seems to be a little bit more beneficial for her at the moment.  Discussed icing regimen and home exercise, which activities to do which wants to avoid.  Patient will increase activity slowly over the course the next several weeks.  Follow-up again in 4 to 8 weeks

## 2019-08-05 NOTE — Patient Instructions (Addendum)
Good to see you See me again in 6 weeks 

## 2019-09-16 ENCOUNTER — Other Ambulatory Visit: Payer: Self-pay

## 2019-09-16 ENCOUNTER — Ambulatory Visit (INDEPENDENT_AMBULATORY_CARE_PROVIDER_SITE_OTHER): Payer: No Typology Code available for payment source | Admitting: Family Medicine

## 2019-09-16 ENCOUNTER — Encounter: Payer: Self-pay | Admitting: Family Medicine

## 2019-09-16 VITALS — BP 110/80 | HR 75 | Ht 65.0 in | Wt 172.0 lb

## 2019-09-16 DIAGNOSIS — M533 Sacrococcygeal disorders, not elsewhere classified: Secondary | ICD-10-CM | POA: Diagnosis not present

## 2019-09-16 DIAGNOSIS — M999 Biomechanical lesion, unspecified: Secondary | ICD-10-CM | POA: Diagnosis not present

## 2019-09-16 NOTE — Patient Instructions (Addendum)
Good to see you Continue the exercises  See me again in 6-8 weeks

## 2019-09-16 NOTE — Progress Notes (Signed)
[name] [address] [place] [address] Phone: [phone] Subjective:   I [name] am serving as a Neurosurgeon for Dr. Antoine Primas.  This visit occurred during the SARS-CoV-2 public health emergency.  Safety protocols were in place, including screening questions prior to the visit, additional usage of staff PPE, and extensive cleaning of exam room while observing appropriate contact time as indicated for disinfecting solutions.   I'm seeing this patient by the request  of:  Shirlean Mylar, MD  CC: Low back pain follow-up  [id number]  Kayla Jackson is a 49 y.o. female coming in with complaint of back and neck pain. Last seen on 08/05/2019 for OMT. Patient states she is doing ok today.  Mild tightness overall.  Did do some yard work does seem to aggravate more of the left side of the lower back.  Denies any radiation down the leg.  States that over this chronic time seems to have been exacerbated.  Has used the gabapentin on a more regular basis recently.  Medications patient has been prescribed:   Taking:         Reviewed prior external information including notes and imaging from previsou exam, outside providers and external EMR if available.   As well as notes that were available from care everywhere and other healthcare systems.  Past medical history, social, surgical and family history all reviewed in electronic medical record.  No pertanent information unless stated regarding to the chief complaint.   No past medical history on file.  No Known Allergies   Review of Systems:  No headache, visual changes, nausea, vomiting, diarrhea, constipation, dizziness, abdominal pain, skin rash, fevers, chills, night sweats, weight loss, swollen lymph nodes, body aches, joint swelling, chest pain, shortness of breath, mood changes. POSITIVE muscle aches  Objective  Blood pressure 110/80, pulse 75, height 5\' 5"  (1.651 m), weight 172 lb (78  kg), SpO2 97 %.   General: No apparent distress alert and oriented x3 mood and affect normal, dressed appropriately.  HEENT: Pupils equal, extraocular movements intact  Respiratory: Patient's speak in full sentences and does not appear short of breath  Cardiovascular: No lower extremity edema, non tender, no erythema  Neuro: Cranial nerves II through XII are intact, neurovascularly intact in all extremities with 2+ DTRs and 2+ pulses.  Gait normal with good balance and coordination.  MSK:  Non tender with full range of motion and good stability and symmetric strength and tone of shoulders, elbows, wrist, hip, knee and ankles bilaterally.  Back - Normal skin, Spine with normal alignment and no deformity.  No tenderness to vertebral process palpation.  Paraspinous muscles are not tender and without spasm.   Range of motion is full at neck and lumbar sacral regions  Osteopathic findings  C2 flexed rotated and side bent right C6 flexed rotated and side bent left T3 extended rotated and side bent right inhaled rib T9 extended rotated and side bent left L2 flexed rotated and side bent right Sacrum right on right       Assessment and Plan:  Sacroiliac dysfunction.  Chronic problem with mild exacerbation.  Encouraged her to take the gabapentin on a more regular basis.  Discussed core stability and strengthening.  Discussed icing regimen and home exercises.  Increase activity slowly over the course the next several weeks.  Follow-up again in 4 to 8 weeks.  Nonallopathic problems  Decision today to treat with OMT was based on Physical Exam  After  verbal consent patient was treated with HVLA, ME, FPR techniques in cervical, rib, thoracic, lumbar, and sacral  areas  Patient tolerated the procedure well with improvement in symptoms  Patient given exercises, stretches and lifestyle modifications  See medications in patient instructions if given  Patient will follow up in 4-8 weeks       The above documentation has been reviewed and is accurate and complete Lyndal Pulley, DO       Note: This dictation was prepared with Dragon dictation along with smaller phrase technology. Any transcriptional errors that result from this process are unintentional.

## 2019-10-25 ENCOUNTER — Encounter: Payer: Self-pay | Admitting: Family Medicine

## 2019-10-26 MED ORDER — PREDNISONE 50 MG PO TABS: 50.0000 mg | ORAL_TABLET | Freq: Every day | ORAL | 0 refills | Status: DC

## 2019-10-29 ENCOUNTER — Encounter: Payer: Self-pay | Admitting: Family Medicine

## 2019-10-29 MED ORDER — PREDNISONE 50 MG PO TABS: 50.0000 mg | ORAL_TABLET | Freq: Every day | ORAL | 0 refills | Status: DC

## 2019-11-04 ENCOUNTER — Ambulatory Visit (INDEPENDENT_AMBULATORY_CARE_PROVIDER_SITE_OTHER): Payer: No Typology Code available for payment source | Admitting: Family Medicine

## 2019-11-04 ENCOUNTER — Encounter: Payer: Self-pay | Admitting: Family Medicine

## 2019-11-04 ENCOUNTER — Other Ambulatory Visit: Payer: Self-pay

## 2019-11-04 VITALS — BP 112/80 | HR 71 | Ht 65.0 in | Wt 171.0 lb

## 2019-11-04 DIAGNOSIS — M542 Cervicalgia: Secondary | ICD-10-CM | POA: Diagnosis not present

## 2019-11-04 DIAGNOSIS — M999 Biomechanical lesion, unspecified: Secondary | ICD-10-CM | POA: Diagnosis not present

## 2019-11-04 NOTE — Patient Instructions (Signed)
Good to see you. Keep up the exercising  Overall looking better See you again in 2 months

## 2019-11-04 NOTE — Progress Notes (Signed)
[name] [address] [place] [address] Phone: [phone] Subjective:   Kayla Jackson, am serving as a scribe for Dr. Antoine Primas.  This visit occurred during the SARS-CoV-2 public health emergency.  Safety protocols were in place, including screening questions prior to the visit, additional usage of staff PPE, and extensive cleaning of exam room while observing appropriate contact time as indicated for disinfecting solutions.    I'm seeing this patient by the request  of:  Shirlean Mylar, MD  CC: Neck and hip pain follow-up  [id number]  Kayla Jackson is a 49 y.o. female coming in with complaint of back and neck pain Patient states that she is doing better is not taking the gabapentin and stopped prednisone it made her nauseous.   Medications patient has been prescribed: Gabapentin, ibuprofen  Taking: Intermittently         Reviewed prior external information including notes and imaging from previsou exam, outside providers and external EMR if available.   As well as notes that were available from care everywhere and other healthcare systems.  Past medical history, social, surgical and family history all reviewed in electronic medical record.  No pertanent information unless stated regarding to the chief complaint.   History reviewed. No pertinent past medical history.  No Known Allergies   Review of Systems:  No headache, visual changes, nausea, vomiting, diarrhea, constipation, dizziness, abdominal pain, skin rash, fevers, chills, night sweats, weight loss, swollen lymph nodes, body aches, joint swelling, chest pain, shortness of breath, mood changes. POSITIVE muscle aches  Objective  Blood pressure 112/80, pulse 71, height 5\' 5"  (1.651 m), weight 171 lb (77.6 kg), SpO2 97 %.   General: No apparent distress alert and oriented x3 mood and affect normal, dressed appropriately.  HEENT: Pupils equal, extraocular movements  intact  Respiratory: Patient's speak in full sentences and does not appear short of breath  Cardiovascular: No lower extremity edema, non tender, no erythema  Neuro: Cranial nerves II through XII are intact, neurovascularly intact in all extremities with 2+ DTRs and 2+ pulses.  Gait normal with good balance and coordination.  MSK:  Non tender with full range of motion and good stability and symmetric strength and tone of shoulders, elbows, wrist, hip, knee and ankles bilaterally.  Neck exam does have some mild loss of lordosis.  Tender to palpation in the paraspinal musculature lumbar spine right greater than left negative Spurling's.  Tightness in the parascapular region  Low back pain over the sacroiliac joint right greater than left.  Mild positive impingement still with the right hip with internal and external range of motion.  Negative straight leg test.  5 out of 5 strength of the lower extremities  Osteopathic findings  C2 flexed rotated and side bent right C7 flexed rotated and side bent left T4 extended rotated and side bent right inhaled rib T10 extended rotated and side bent left L2 flexed rotated and side bent right Sacrum right on right       Assessment and Plan:  Neck pain Much more stable.  Seems to be doing relatively well.  Discussed the ibuprofen and the gabapentin which patient is only taking intermittently.  Discussed which activities to do which wants to avoid.  Increase activity slowly.  Follow-up again in 6 to 8 weeks    Nonallopathic problems  Decision today to treat with OMT was based on Physical Exam  After verbal consent patient was treated with HVLA, ME, FPR techniques  in cervical, rib, thoracic, lumbar, and sacral  areas  Patient tolerated the procedure well with improvement in symptoms  Patient given exercises, stretches and lifestyle modifications  See medications in patient instructions if given  Patient will follow up in 6-8 weeks       The above documentation has been reviewed and is accurate and complete [name], DO       Note: This dictation was prepared with Dragon dictation along with smaller phrase technology. Any transcriptional errors that result from this process are unintentional.

## 2019-11-04 NOTE — Assessment & Plan Note (Signed)
Much more stable.  Seems to be doing relatively well.  Discussed the ibuprofen and the gabapentin which patient is only taking intermittently.  Discussed which activities to do which wants to avoid.  Increase activity slowly.  Follow-up again in 6 to 8 weeks

## 2019-12-31 NOTE — Progress Notes (Signed)
[name] [address] [place] [address] Phone: [phone] Subjective:   I [name] am serving as a Neurosurgeon for Dr. Antoine Primas.  This visit occurred during the SARS-CoV-2 public health emergency.  Safety protocols were in place, including screening questions prior to the visit, additional usage of staff PPE, and extensive cleaning of exam room while observing appropriate contact time as indicated for disinfecting solutions.   I'm seeing this patient by the request  of:  Shirlean Mylar, MD  CC: Back pain follow-up, new onset left knee pain  [id number]  Zunaira Hossain is a 49 y.o. female coming in with complaint of back and neck pain. OMT 11/09/2019. Patient states she is really sore today. Left knee pain today. Patient does not remember MOI. About 3 weeks ago she felt pain with extension. Knee is weak. With flexion after about 3-4 mins she feels pain with standing. Pain wakes her up at night. States the pain is posterior. Patient states with standing at work she has no issues. Has had issues with this knee before. Has decreased exercise and taking Ibuprofen. Some swelling and patient believes there is something going on with the knee cap. Only remembers being on her knees for a period of time and has had pain since then. She was sitting in flexion on her knee when she felt pain after standing.   Medications patient has been prescribed: Gabapentin  Taking: Yes         Reviewed prior external information including notes and imaging from previsou exam, outside providers and external EMR if available.   As well as notes that were available from care everywhere and other healthcare systems.  Past medical history, social, surgical and family history all reviewed in electronic medical record.  No pertanent information unless stated regarding to the chief complaint.   No past medical history on file.  No Known Allergies   Review of  Systems:  No headache, visual changes, nausea, vomiting, diarrhea, constipation, dizziness, abdominal pain, skin rash, fevers, chills, night sweats, weight loss, swollen lymph nodes, body aches, joint swelling, chest pain, shortness of breath, mood changes. POSITIVE muscle aches  Objective  Blood pressure 122/80, pulse 80, height 5\' 5"  (1.651 m), weight 179 lb (81.2 kg), SpO2 98 %.   General: No apparent distress alert and oriented x3 mood and affect normal, dressed appropriately.  HEENT: Pupils equal, extraocular movements intact  Respiratory: Patient's speak in full sentences and does not appear short of breath  Cardiovascular: No lower extremity edema, non tender, no erythema  Neuro: Cranial nerves II through XII are intact, neurovascularly intact in all extremities with 2+ DTRs and 2+ pulses.  Gait normal with good balance and coordination.  MSK: Left knee exam shows the patient does have some mild lateral tracking of the patella noted.  Trace effusion noted.  Lacks the last 5 degrees of extension.  Good stability though with valgus and varus force ACL is intact Back -low back exam shows the patient does have some mild loss of lordosis, tightness to palpation in the paraspinal musculature of the lumbar spine right greater than left.  Tender to palpation diffusely.  But near full range of motion.  Osteopathic findings  C2 flexed rotated and side bent right C6 flexed rotated and side bent left T3 extended rotated and side bent right inhaled rib T9 extended rotated and side bent left L2 flexed rotated and side bent right Sacrum right on right  Limited musculoskeletal ultrasound  was performed and interpreted by [name]  Limited ultrasound of patient's left knee shows that there is a trace effusion still remaining.  Patient's medial meniscus does have some bulging but no acute tear appreciated.  No sign of any Baker's cyst, otherwise fairly unremarkable Impression: Mild joint  effusion but otherwise unremarkable     Assessment and Plan:    Nonallopathic problems  Decision today to treat with OMT was based on Physical Exam  After verbal consent patient was treated with HVLA, ME, FPR techniques in cervical, rib, thoracic, lumbar, and sacral  areas  Patient tolerated the procedure well with improvement in symptoms  Patient given exercises, stretches and lifestyle modifications  See medications in patient instructions if given  Patient will follow up in 4-8 weeks  Patient was measured and fitted for an off-the-shelf brace. Adjustments were made to brace to ensure proper fit.      The above documentation has been reviewed and is accurate and complete [name], DO       Note: This dictation was prepared with Dragon dictation along with smaller phrase technology. Any transcriptional errors that result from this process are unintentional.

## 2020-01-03 ENCOUNTER — Other Ambulatory Visit: Payer: Self-pay

## 2020-01-03 ENCOUNTER — Ambulatory Visit (INDEPENDENT_AMBULATORY_CARE_PROVIDER_SITE_OTHER): Payer: No Typology Code available for payment source | Admitting: Family Medicine

## 2020-01-03 ENCOUNTER — Ambulatory Visit: Payer: Self-pay

## 2020-01-03 ENCOUNTER — Encounter: Payer: Self-pay | Admitting: Family Medicine

## 2020-01-03 VITALS — BP 122/80 | HR 80 | Ht 65.0 in | Wt 179.0 lb

## 2020-01-03 DIAGNOSIS — M25562 Pain in left knee: Secondary | ICD-10-CM

## 2020-01-03 DIAGNOSIS — M999 Biomechanical lesion, unspecified: Secondary | ICD-10-CM | POA: Diagnosis not present

## 2020-01-03 DIAGNOSIS — M533 Sacrococcygeal disorders, not elsewhere classified: Secondary | ICD-10-CM

## 2020-01-03 DIAGNOSIS — S83002A Unspecified subluxation of left patella, initial encounter: Secondary | ICD-10-CM | POA: Diagnosis not present

## 2020-01-03 DIAGNOSIS — G8929 Other chronic pain: Secondary | ICD-10-CM

## 2020-01-03 NOTE — Assessment & Plan Note (Signed)
Patient in trace effusion and does have a positive patellar grind.  Patient is a lacking the last 5 degrees of flexion of the knee but does have full extension.  I believe the patient did have more of a subluxation, patellofemoral Tru pull lite brace given today and exercises, discussed icing and home exercises, discussed which activities to avoid but home exercise program with athletic trainer given today.  Follow-up again in 6 to 8 weeks

## 2020-01-03 NOTE — Assessment & Plan Note (Signed)
Stable but mild discomfort and pain.  Discussed icing regimen, home exercise, which activities to do which wants to avoid and discussed ergonomics.  Discussed medications such as the gabapentin that has been helpful for this chronic problem with mild exacerbation

## 2020-01-03 NOTE — Patient Instructions (Signed)
Good to see you Exercise 3 times a week TPL brace  See me again in 6 weeks

## 2020-02-14 ENCOUNTER — Encounter: Payer: Self-pay | Admitting: Family Medicine

## 2020-02-16 ENCOUNTER — Ambulatory Visit (INDEPENDENT_AMBULATORY_CARE_PROVIDER_SITE_OTHER): Payer: No Typology Code available for payment source | Admitting: Family Medicine

## 2020-02-16 ENCOUNTER — Other Ambulatory Visit: Payer: Self-pay

## 2020-02-16 ENCOUNTER — Encounter: Payer: Self-pay | Admitting: Family Medicine

## 2020-02-16 VITALS — BP 116/80 | HR 87 | Ht 65.0 in | Wt 179.0 lb

## 2020-02-16 DIAGNOSIS — M542 Cervicalgia: Secondary | ICD-10-CM

## 2020-02-16 DIAGNOSIS — M999 Biomechanical lesion, unspecified: Secondary | ICD-10-CM

## 2020-02-16 NOTE — Assessment & Plan Note (Signed)
Mild increase in neck pain.  He does have the gabapentin if needed.  Patient has recently had a cough for quite some time likely contributing to some of that increase in discomfort.  Follow-up with me again in 6

## 2020-02-16 NOTE — Patient Instructions (Addendum)
Good to see you Nexium daily for 2 weeks. If it doesn't work take doxy See me again in 6 weeks

## 2020-02-16 NOTE — Progress Notes (Signed)
[name] [address] [place] [address] Phone: [phone] Subjective:   I [name] am serving as a Neurosurgeon for Dr. Antoine Primas.  This visit occurred during the SARS-CoV-2 public health emergency.  Safety protocols were in place, including screening questions prior to the visit, additional usage of staff PPE, and extensive cleaning of exam room while observing appropriate contact time as indicated for disinfecting solutions.   I'm seeing this patient by the request  of:  Shirlean Mylar, MD  CC: Neck and back pain follow-up  [id number]  Kayla Jackson is a 49 y.o. female coming in with complaint of back and neck pain. OMT9/20/2021 as well as left knee pain. Patient states  Left knee is doing well. Neck and back is doing well.  Patient has had some mild increase in neck pain recently.  Has had a cough.  Has done a round of prednisone as well as azithromycin.  Patient has avoided other antibiotics secondary to some gastrointestinal problems.  Medications patient has been prescribed: Gabapentin  Taking: Very intermittently         Reviewed prior external information including notes and imaging from previsou exam, outside providers and external EMR if available.   As well as notes that were available from care everywhere and other healthcare systems.  Past medical history, social, surgical and family history all reviewed in electronic medical record.  No pertanent information unless stated regarding to the chief complaint.   No past medical history on file.  No Known Allergies   Review of Systems:  No headache, visual changes, nausea, vomiting, diarrhea, constipation, dizziness, abdominal pain, skin rash, fevers, chills, night sweats, weight loss, swollen lymph nodes, body aches, joint swelling, chest pain, shortness of breath, mood changes. POSITIVE muscle aches, cough  Objective  There were no vitals taken for this visit.     General: No apparent distress alert and oriented x3 mood and affect normal, dressed appropriately.  HEENT: Pupils equal, extraocular movements intact  Respiratory: Patient's speak in full sentences and does not appear short of breath  Cardiovascular: No lower extremity edema, non tender, no erythema  Neuro: Cranial nerves II through XII are intact, neurovascularly intact in all extremities with 2+ DTRs and 2+ pulses.  Gait normal with good balance and coordination.  MSK:  Non tender with full range of motion and good stability and symmetric strength and tone of shoulders, elbows, wrist, hip, knee and ankles bilaterally.  Neck exam does have some mild loss of lordosis.  Patient has negative Spurling's.  Tightness in the parascapular region right greater than left.  Osteopathic findings  C2 flexed rotated and side bent right C6 flexed rotated and side bent left T3 extended rotated and side bent right inhaled rib T9 extended rotated and side bent left L2 flexed rotated and side bent right Sacrum right on right       Assessment and Plan:  Neck pain Mild increase in neck pain.  He does have the gabapentin if needed.  Patient has recently had a cough for quite some time likely contributing to some of that increase in discomfort.  Follow-up with me again in 6    Nonallopathic problems  Decision today to treat with OMT was based on Physical Exam  After verbal consent patient was treated with HVLA, ME, FPR techniques in cervical, rib, thoracic, lumbar, and sacral  areas  Patient tolerated the procedure well with improvement in symptoms  Patient given exercises, stretches and lifestyle  modifications  See medications in patient instructions if given  Patient will follow up in 4-8 weeks      The above documentation has been reviewed and is accurate and complete [name], DO       Note: This dictation was prepared with Dragon dictation along with smaller phrase  technology. Any transcriptional errors that result from this process are unintentional.

## 2020-03-14 ENCOUNTER — Encounter: Payer: Self-pay | Admitting: Family Medicine

## 2020-03-21 ENCOUNTER — Encounter: Payer: Self-pay | Admitting: Family Medicine

## 2020-03-28 NOTE — Progress Notes (Signed)
[name] [address] [place] [address] Phone: [phone] Subjective:   I Kayla Jackson am serving as a Neurosurgeon for Dr. Antoine Primas.  This visit occurred during the SARS-CoV-2 public health emergency.  Safety protocols were in place, including screening questions prior to the visit, additional usage of staff PPE, and extensive cleaning of exam room while observing appropriate contact time as indicated for disinfecting solutions.   I'm seeing this patient by the request  of:  Shirlean Mylar, MD  CC: Foot pain, back pain follow-up  [id number]  Kayla Jackson is a 49 y.o. female coming in with complaint of back and neck pain. OMT 02/16/2020. Patient states she has a left foot problem today. Purchased new shoes and wore them for a shopping event that was a few hours. States walking barefoot it is difficult to walk. Left worse than right. Hell pain. Pain with squeezing the heel and walking. Base of the 5th is tender. 8/10 at its worse. Better in shoes. Has cut back her 14 hour days.   Medications patient has been prescribed: None  Taking:         Reviewed prior external information including notes and imaging from previsou exam, outside providers and external EMR if available.   As well as notes that were available from care everywhere and other healthcare systems.  Past medical history, social, surgical and family history all reviewed in electronic medical record.  No pertanent information unless stated regarding to the chief complaint.   No past medical history on file.  No Known Allergies   Review of Systems:  No headache, visual changes, nausea, vomiting, diarrhea, constipation, dizziness, abdominal pain, skin rash, fevers, chills, night sweats, weight loss, swollen lymph nodes, body aches, joint swelling, chest pain, shortness of breath, mood changes. POSITIVE muscle aches  Objective  Blood pressure 130/80, pulse 84, height 5\' 5"   (1.651 m), weight 183 lb (83 kg), SpO2 99 %.   General: No apparent distress alert and oriented x3 mood and affect normal, dressed appropriately.  HEENT: Pupils equal, extraocular movements intact  Respiratory: Patient's speak in full sentences and does not appear short of breath  Cardiovascular: No lower extremity edema, non tender, no erythema  Neuro: Cranial nerves II through XII are intact, neurovascularly intact in all extremities with 2+ DTRs and 2+ pulses.  Gait normal with good balance and coordination.  Left foot exam shows the patient does have severe tenderness to palpation over the medial calcaneal area proximally.  More pain over the plantar aspect as well.  Mild overpronation of the hindfoot. MSK: Low back exam does have some mild loss of lordosis.  Some tenderness to palpation of the paraspinal musculature lumbar spine right greater than left.  Negative straight leg test.  Tightness with FABER test.  Limited musculoskeletal ultrasound was performed and interpreted by \ Limited ultrasound of patient's left heel shows that patient has significant enlargement of the plantar fascia noted.  Mild increase in Doppler flow in hypoechoic changes.  No acute tears no noted. Impression: Plantar fasciitis  Osteopathic findings  C6 flexed rotated and side bent left T3 extended rotated and side bent right inhaled rib T9 extended rotated and side bent left L2 flexed rotated and side bent right Sacrum right on right       Assessment and Plan:  SI (sacroiliac) joint dysfunction Chronic problem.  Stable overall.  Tightness noted of the right sacrum and the right hip.  Does have  impingement syndrome.  We will continue to monitor.  Responded well to osteopathic manipulation.  Plantar fasciitis of left foot Patient does have fairly severe plantar fasciitis.  CAM Walker given.  Discussed nitroglycerin.  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  Discussed  gabapentin.  Discussed home exercises.  Increase activity otherwise.  Discussed weight loss.  Follow-up with me again 4 to 6 weeks     Nonallopathic problems  Decision today to treat with OMT was based on Physical Exam  After verbal consent patient was treated with HVLA, ME, FPR techniques in cervical, rib, thoracic, lumbar, and sacral  areas  Patient tolerated the procedure well with improvement in symptoms  Patient given exercises, stretches and lifestyle modifications  See medications in patient instructions if given  Patient will follow up in 4-8 weeks      The above documentation has been reviewed and is accurate and complete [name], DO       Note: This dictation was prepared with Dragon dictation along with smaller phrase technology. Any transcriptional errors that result from this process are unintentional.

## 2020-03-29 ENCOUNTER — Other Ambulatory Visit: Payer: Self-pay

## 2020-03-29 ENCOUNTER — Ambulatory Visit: Payer: Self-pay

## 2020-03-29 ENCOUNTER — Ambulatory Visit (INDEPENDENT_AMBULATORY_CARE_PROVIDER_SITE_OTHER): Payer: No Typology Code available for payment source | Admitting: Family Medicine

## 2020-03-29 ENCOUNTER — Encounter: Payer: Self-pay | Admitting: Family Medicine

## 2020-03-29 VITALS — BP 130/80 | HR 84 | Ht 65.0 in | Wt 183.0 lb

## 2020-03-29 DIAGNOSIS — M722 Plantar fascial fibromatosis: Secondary | ICD-10-CM | POA: Diagnosis not present

## 2020-03-29 DIAGNOSIS — M999 Biomechanical lesion, unspecified: Secondary | ICD-10-CM | POA: Diagnosis not present

## 2020-03-29 DIAGNOSIS — M533 Sacrococcygeal disorders, not elsewhere classified: Secondary | ICD-10-CM

## 2020-03-29 DIAGNOSIS — M79672 Pain in left foot: Secondary | ICD-10-CM | POA: Diagnosis not present

## 2020-03-29 MED ORDER — PREDNISONE 50 MG PO TABS: ORAL_TABLET | ORAL | 0 refills | Status: DC

## 2020-03-29 MED ORDER — NITROGLYCERIN 0.1 MG/HR TD PT24: MEDICATED_PATCH | TRANSDERMAL | 12 refills | Status: AC

## 2020-03-29 NOTE — Assessment & Plan Note (Signed)
Patient does have fairly severe plantar fasciitis.  CAM Walker given.  Discussed nitroglycerin.  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  Discussed gabapentin.  Discussed home exercises.  Increase activity otherwise.  Discussed weight loss.  Follow-up with me again 4 to 6 weeks

## 2020-03-29 NOTE — Assessment & Plan Note (Signed)
Chronic problem.  Stable overall.  Tightness noted of the right sacrum and the right hip.  Does have impingement syndrome.  We will continue to monitor.  Responded well to osteopathic manipulation.

## 2020-03-29 NOTE — Patient Instructions (Signed)
Good to see you Boot on the left foot Predinsone 50 mg for 5 days Nitroglycerin Protocol   Apply 1/4 nitroglycerin patch to affected area daily.  Change position of patch within the affected area every 24 hours.  You may experience a headache during the first 1-2 weeks of using the patch, these should subside.  If you experience headaches after beginning nitroglycerin patch treatment, you may take your preferred over the counter pain reliever.  Another side effect of the nitroglycerin patch is skin irritation or rash related to patch adhesive.  Please notify our office if you develop more severe headaches or rash, and stop the patch.  Tendon healing with nitroglycerin patch may require 12 to 24 weeks depending on the extent of injury.  Men should not use if taking Viagra, Cialis, or Levitra.   Do not use if you have migraines or rosacea. oofos or hoka recover sandals Spenco orthotics "total support" See me again in 5 weeks

## 2020-05-02 NOTE — Progress Notes (Signed)
[facility] [address] [place] [address] Phone: [phone] Subjective:   Kayla Jackson, am serving as a scribe for Dr. Antoine Primas. This visit occurred during the SARS-CoV-2 public health emergency.  Safety protocols were in place, including screening questions prior to the visit, additional usage of staff PPE, and extensive cleaning of exam room while observing appropriate contact time as indicated for disinfecting solutions.   I'm seeing this patient by the request  of:  Shirlean Mylar, MD  CC: Foot pain and back pain follow-up  [id number]   03/29/2020 Patient does have fairly severe plantar fasciitis.  CAM Walker given.  Discussed nitroglycerin.  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  Discussed gabapentin.  Discussed home exercises.  Increase activity otherwise.  Discussed weight loss.  Follow-up with me again 4 to 6 weeks  Update 05/03/2020 Kayla Jackson is a 50 y.o. female coming in with complaint of back and neck pain.OMT 03/29/2020. Plantar fascitis, left f/u as well. Patient states that she is having an increase in back pain due to using the boot.  Patient states that he is feeling overall seems to be more worsening back pain secondary to her walking in the boot.  Just chronic exacerbation of her underlying problem.  No new symptoms.  Foot overall is improving. Pain now is at base of calcaneous. Did try not wearing the boot for 6 hour shift which caused achy pain in back of heel.  Medications patient has been prescribed: Gabapentin, ibuprofen          Reviewed prior external information including notes and imaging from previsou exam, outside providers and external EMR if available.   As well as notes that were available from care everywhere and other healthcare systems.  Past medical history, social, surgical and family history all reviewed in electronic medical record.  No pertanent information unless stated  regarding to the chief complaint.   No past medical history on file.  No Known Allergies   Review of Systems:  No headache, visual changes, nausea, vomiting, diarrhea, constipation, dizziness, abdominal pain, skin rash, fevers, chills, night sweats, weight loss, swollen lymph nodes, body aches, joint swelling, chest pain, shortness of breath, mood changes. POSITIVE muscle aches  Objective  Blood pressure (!) 122/92, pulse 92, height 5\' 5"  (1.651 m), weight 181 lb (82.1 kg), SpO2 99 %.   General: No apparent distress alert and oriented x3 mood and affect normal, dressed appropriately.  HEENT: Pupils equal, extraocular movements intact  Respiratory: Patient's speak in full sentences and does not appear short of breath  Cardiovascular: No lower extremity edema, non tender, no erythema  Neuro: Cranial nerves II through XII are intact, neurovascularly intact in all extremities with 2+ DTRs and 2+ pulses.  Gait normal with good balance and coordination.  MSK:  Non tender with full range of motion and good stability and symmetric strength and tone of shoulders, elbows, wrist, knee and ankles bilaterally.  Foot exam shows the patient does have a mild loss of the longitudinal arch with mild overpronation.  Mild breakdown of the transverse arch noted as well.  Patient is still tender over the calcaneal area on the plantar aspect of the foot.  Significantly less so than previous exam swelling seems to be good.  Patient does have tightness noted of the posterior cord  Back exam does show that patient does have tightness noted in the paraspinal musculature mostly right greater than left.  Tightness with  on the right.  Continue impingement of the right hip noted  Osteopathic findings  C6 flexed rotated and side bent left T5 extended rotated and side bent right inhaled rib L3 flexed rotated and side bent right Sacrum right on right  Limited musculoskeletal ultrasound was performed and  interpreted by [name]  Limited ultrasound of patient's plantar fascia area shows that patient still has a mild irregularity of the plantar fascia that does not appear to be tight but significant decrease in the swelling and the inflammation.  Patient though does still have irritation noted of the subcutaneous fat pad noted.  No increase in Doppler flow, and no masses appreciated.  No cortical irregularity noted of the calcaneus. Impression: Significant improvement from previous exam but continued fat pad irritation     Assessment and Plan:  Plantar fasciitis of left foot Patient did have what appeared to be more of a planter fasciitis previously but now seems to just be irritation more of the soft tissue in the surrounding area.  Patient does have the gabapentin for her back and will continue with the anti-inflammatories.  Can consider another dose of prednisone if necessary secondary to more of the fat pad irritation.  If continuing to have difficulty advanced imaging would be warranted.  We will transition patient out of the boot though secondary to it starting to affect her back on a more regular basis and mild stiffness of the posterior cord.  Follow-up with me again in 4 to 6 weeks  SI (sacroiliac) joint dysfunction We will get x-rays today.  Has been quite sometime since we have further evaluated both lumbar and the sacral area.  Mild exacerbation of a chronic problem secondary to how she has been ambulating secondary to the cam walker.  Patient though can take ibuprofen it does seem to be beneficial.  Discussed home exercises and icing regimen.  Increase activity slowly.  Follow-up again 4 to 8 weeks    Nonallopathic problems  Decision today to treat with OMT was based on Physical Exam  After verbal consent patient was treated with HVLA, ME, FPR techniques in cervical, rib, thoracic, lumbar, and sacral  areas  Patient tolerated the procedure well with improvement in  symptoms  Patient given exercises, stretches and lifestyle modifications  See medications in patient instructions if given  Patient will follow up in 4-8 weeks      The above documentation has been reviewed and is accurate and complete [name], DO       Note: This dictation was prepared with Dragon dictation along with smaller phrase technology. Any transcriptional errors that result from this process are unintentional.

## 2020-05-03 ENCOUNTER — Encounter: Payer: Self-pay | Admitting: Family Medicine

## 2020-05-03 ENCOUNTER — Ambulatory Visit (INDEPENDENT_AMBULATORY_CARE_PROVIDER_SITE_OTHER): Payer: No Typology Code available for payment source

## 2020-05-03 ENCOUNTER — Other Ambulatory Visit: Payer: Self-pay

## 2020-05-03 ENCOUNTER — Ambulatory Visit: Payer: Self-pay

## 2020-05-03 ENCOUNTER — Ambulatory Visit (INDEPENDENT_AMBULATORY_CARE_PROVIDER_SITE_OTHER): Payer: No Typology Code available for payment source | Admitting: Family Medicine

## 2020-05-03 VITALS — BP 122/92 | HR 92 | Ht 65.0 in | Wt 181.0 lb

## 2020-05-03 DIAGNOSIS — M545 Low back pain, unspecified: Secondary | ICD-10-CM | POA: Diagnosis not present

## 2020-05-03 DIAGNOSIS — M999 Biomechanical lesion, unspecified: Secondary | ICD-10-CM | POA: Diagnosis not present

## 2020-05-03 DIAGNOSIS — M79672 Pain in left foot: Secondary | ICD-10-CM

## 2020-05-03 DIAGNOSIS — M722 Plantar fascial fibromatosis: Secondary | ICD-10-CM | POA: Diagnosis not present

## 2020-05-03 DIAGNOSIS — M533 Sacrococcygeal disorders, not elsewhere classified: Secondary | ICD-10-CM

## 2020-05-03 NOTE — Patient Instructions (Addendum)
   xray today Orthotics at [facility] office Transition to shoe take boot to work just in case Ok to take IBU but if worsening pain we can do prednsione Back is worse due to how you are walking See me in 4-5 weeks

## 2020-05-03 NOTE — Assessment & Plan Note (Signed)
Patient did have what appeared to be more of a planter fasciitis previously but now seems to just be irritation more of the soft tissue in the surrounding area.  Patient does have the gabapentin for her back and will continue with the anti-inflammatories.  Can consider another dose of prednisone if necessary secondary to more of the fat pad irritation.  If continuing to have difficulty advanced imaging would be warranted.  We will transition patient out of the boot though secondary to it starting to affect her back on a more regular basis and mild stiffness of the posterior cord.  Follow-up with me again in 4 to 6 weeks

## 2020-05-03 NOTE — Assessment & Plan Note (Signed)
We will get x-rays today.  Has been quite sometime since we have further evaluated both lumbar and the sacral area.  Mild exacerbation of a chronic problem secondary to how she has been ambulating secondary to the cam walker.  Patient though can take ibuprofen it does seem to be beneficial.  Discussed home exercises and icing regimen.  Increase activity slowly.  Follow-up again 4 to 8 weeks

## 2020-05-23 ENCOUNTER — Encounter: Payer: Self-pay | Admitting: Family Medicine

## 2020-05-30 ENCOUNTER — Encounter: Payer: Self-pay | Admitting: Family Medicine

## 2020-05-30 ENCOUNTER — Other Ambulatory Visit: Payer: Self-pay

## 2020-05-30 ENCOUNTER — Ambulatory Visit (INDEPENDENT_AMBULATORY_CARE_PROVIDER_SITE_OTHER): Payer: No Typology Code available for payment source | Admitting: Family Medicine

## 2020-05-30 ENCOUNTER — Ambulatory Visit: Payer: Self-pay

## 2020-05-30 VITALS — BP 118/86 | HR 88 | Ht 65.0 in | Wt 186.0 lb

## 2020-05-30 DIAGNOSIS — M2550 Pain in unspecified joint: Secondary | ICD-10-CM | POA: Diagnosis not present

## 2020-05-30 DIAGNOSIS — M722 Plantar fascial fibromatosis: Secondary | ICD-10-CM

## 2020-05-30 DIAGNOSIS — M79672 Pain in left foot: Secondary | ICD-10-CM | POA: Diagnosis not present

## 2020-05-30 DIAGNOSIS — M999 Biomechanical lesion, unspecified: Secondary | ICD-10-CM | POA: Diagnosis not present

## 2020-05-30 DIAGNOSIS — M533 Sacrococcygeal disorders, not elsewhere classified: Secondary | ICD-10-CM

## 2020-05-30 LAB — T3, FREE: T3, Free: 3.2 pg/mL (ref 2.3–4.2)

## 2020-05-30 LAB — COMPREHENSIVE METABOLIC PANEL
ALT: 14 U/L (ref 0–35)
AST: 15 U/L (ref 0–37)
Albumin: 4.3 g/dL (ref 3.5–5.2)
Alkaline Phosphatase: 55 U/L (ref 39–117)
BUN: 17 mg/dL (ref 6–23)
CO2: 30 mEq/L (ref 19–32)
Calcium: 9.3 mg/dL (ref 8.4–10.5)
Chloride: 104 mEq/L (ref 96–112)
Creatinine, Ser: 0.73 mg/dL (ref 0.40–1.20)
GFR: 94.76 mL/min (ref >60.00)
Glucose, Bld: 94 mg/dL (ref 70–99)
Potassium: 3.8 mEq/L (ref 3.5–5.1)
Sodium: 141 mEq/L (ref 135–145)
Total Bilirubin: 0.4 mg/dL (ref 0.2–1.2)
Total Protein: 7.5 g/dL (ref 6.0–8.3)

## 2020-05-30 LAB — CBC WITH DIFFERENTIAL/PLATELET
Basophils Absolute: 0.1 10*3/uL (ref 0.0–0.1)
Basophils Relative: 0.9 % (ref 0.0–3.0)
Eosinophils Absolute: 0.1 10*3/uL (ref 0.0–0.7)
Eosinophils Relative: 2.7 % (ref 0.0–5.0)
HCT: 38.5 % (ref 36.0–46.0)
Hemoglobin: 13.4 g/dL (ref 12.0–15.0)
Lymphocytes Relative: 27.4 % (ref 12.0–46.0)
Lymphs Abs: 1.5 10*3/uL (ref 0.7–4.0)
MCHC: 34.9 g/dL (ref 30.0–36.0)
MCV: 88 fl (ref 78.0–100.0)
Monocytes Absolute: 0.4 10*3/uL (ref 0.1–1.0)
Monocytes Relative: 6.8 % (ref 3.0–12.0)
Neutro Abs: 3.3 10*3/uL (ref 1.4–7.7)
Neutrophils Relative %: 62.2 % (ref 43.0–77.0)
Platelets: 233 10*3/uL (ref 150.0–400.0)
RBC: 4.37 Mil/uL (ref 3.87–5.11)
RDW: 12.4 % (ref 11.5–15.5)
WBC: 5.3 10*3/uL (ref 4.0–10.5)

## 2020-05-30 LAB — SEDIMENTATION RATE: Sed Rate: 4 mm/hr (ref 0–30)

## 2020-05-30 LAB — VITAMIN D 25 HYDROXY (VIT D DEFICIENCY, FRACTURES): VITD: 24.78 ng/mL — ABNORMAL LOW (ref 30.00–100.00)

## 2020-05-30 LAB — IBC PANEL
Iron: 91 ug/dL (ref 42–145)
Saturation Ratios: 27.8 % (ref 20.0–50.0)
Transferrin: 234 mg/dL (ref 212.0–360.0)

## 2020-05-30 LAB — C-REACTIVE PROTEIN: CRP: <1 mg/dL (ref 0.5–20.0)

## 2020-05-30 LAB — URIC ACID: Uric Acid, Serum: 5.2 mg/dL (ref 2.4–7.0)

## 2020-05-30 LAB — T4, FREE: Free T4: 0.91 ng/dL (ref 0.60–1.60)

## 2020-05-30 LAB — TSH: TSH: 1.33 u[IU]/mL (ref 0.35–4.50)

## 2020-05-30 NOTE — Assessment & Plan Note (Signed)
Worsening pain at this time.  Likely secondary to compensation.  This seems to be more on the left side than the right side.  Patient is having increasing discomfort and is unable to tolerate as much as she has been doing previously.  Patient will follow up with me again in 4 weeks otherwise.

## 2020-05-30 NOTE — Progress Notes (Signed)
[name] [address] [place] [address] Phone: [phone] Subjective:   Kayla Jackson, am serving as a scribe for Dr. Antoine Primas. This visit occurred during the SARS-CoV-2 public health emergency.  Safety protocols were in place, including screening questions prior to the visit, additional usage of staff PPE, and extensive cleaning of exam room while observing appropriate contact time as indicated for disinfecting solutions.   I'm seeing this patient by the request  of:  Shirlean Mylar, MD  CC: Foot pain, back pain follow-up  [id number]  Kayla Jackson is a 50 y.o. female coming in with complaint of back and neck pain. OMT 05/03/2020. Seen for left plantar fasciitis last visit. Was to get orthotics made at our [facility] office. Patient states that she was wearing boot and switched to Air Heel. Pain is worse. Does stand for 12 hours a day.   Having increase in left side of lower back due to the height difference from one side to the other when using Air Heel.  Using IBU and tart cherry extract for pain management.   Medications patient has been prescribed: None  Taking:         Reviewed prior external information including notes and imaging from previsou exam, outside providers and external EMR if available.   As well as notes that were available from care everywhere and other healthcare systems.  Past medical history, social, surgical and family history all reviewed in electronic medical record.  No pertanent information unless stated regarding to the chief complaint.   No past medical history on file.  No Known Allergies   Review of Systems:  No headache, visual changes, nausea, vomiting, diarrhea, constipation, dizziness, abdominal pain, skin rash, fevers, chills, night sweats, weight loss, swollen lymph nodes, body aches, joint swelling, chest pain, shortness of breath, mood changes. POSITIVE muscle aches  Objective  Blood  pressure 118/86, pulse 88, height 5\' 5"  (1.651 m), weight 186 lb (84.4 kg), SpO2 98 %.   General: No apparent distress alert and oriented x3 mood and affect normal, dressed appropriately.  HEENT: Pupils equal, extraocular movements intact  Respiratory: Patient's speak in full sentences and does not appear short of breath  Cardiovascular: No lower extremity edema, non tender, no erythema  Gait antalgic MSK:  Non tender with full range of motion and good stability and symmetric strength and tone of shoulders, elbows, wrist, hip, knee and ankles bilaterally.  Back -low back exam does have some mild loss of lordosis.  Tightness noted in the sacroiliac joint left greater than right.  Different than patient's normal. Left heel this is still have significant tenderness to palpation at the calcaneal region.  Good range of motion of the ankle.  No significant stiffness noted.  Limited musculoskeletal ultrasound was performed today interpreted by  Limited ultrasound of patient's left heel shows the patient still has some very mild thickening of the plantar fascia but no true tearing appreciated.  Patient does have some calcific changes noted at the origin.  Osteopathic findings  C3 flexed rotated and side bent right T3 extended rotated and side bent right inhaled rib T8 extended rotated and side bent left L5 flexed rotated and side bent right Sacrum left on the left       Assessment and Plan:  Plantar fasciitis of left foot Patient has made some improvement but still not significant enough.  We discussed potential injection with patient declined.  We discussed which activities to  do which wants to avoid.  Patient is having worsening pain and weight gain.  We will get laboratory work-up to see if anything else is contributing with patient's strong family history of rheumatoid arthritis in both mother and father.  We will check on patient's custom orthotics.  Patient was referred and  I do think very will be beneficial.  If continued pain will consider injections.  SI (sacroiliac) joint dysfunction Worsening pain at this time.  Likely secondary to compensation.  This seems to be more on the left side than the right side.  Patient is having increasing discomfort and is unable to tolerate as much as she has been doing previously.  Patient will follow up with me again in 4 weeks otherwise.    Nonallopathic problems  Decision today to treat with OMT was based on Physical Exam  After verbal consent patient was treated with HVLA, ME, FPR techniques in cervical, rib, thoracic, lumbar, and sacral  areas  Patient tolerated the procedure well with improvement in symptoms  Patient given exercises, stretches and lifestyle modifications  See medications in patient instructions if given  Patient will follow up in 4-8 weeks      The above documentation has been reviewed and is accurate and complete [name], DO       Note: This dictation was prepared with Dragon dictation along with smaller phrase technology. Any transcriptional errors that result from this process are unintentional.

## 2020-05-30 NOTE — Assessment & Plan Note (Signed)
Patient has made some improvement but still not significant enough.  We discussed potential injection with patient declined.  We discussed which activities to do which wants to avoid.  Patient is having worsening pain and weight gain.  We will get laboratory work-up to see if anything else is contributing with patient's strong family history of rheumatoid arthritis in both mother and father.  We will check on patient's custom orthotics.  Patient was referred and I do think very will be beneficial.  If continued pain will consider injections.

## 2020-05-30 NOTE — Patient Instructions (Addendum)
Labs today Read about PRP Physical therapy for orthotics at [facility] [phone] See me in 4-5 weeks

## 2020-05-31 ENCOUNTER — Encounter: Payer: Self-pay | Admitting: Family Medicine

## 2020-05-31 LAB — ANGIOTENSIN CONVERTING ENZYME: Angiotensin-Converting Enzyme: 29 U/L (ref 9–67)

## 2020-05-31 LAB — CYCLIC CITRUL PEPTIDE ANTIBODY, IGG: Cyclic Citrullin Peptide Ab: <16 UNITS

## 2020-05-31 LAB — CEA: CEA: <0.5 ng/mL

## 2020-05-31 LAB — CALCIUM, IONIZED: Calcium, Ion: 4.97 mg/dL (ref 4.8–5.6)

## 2020-05-31 LAB — RHEUMATOID FACTOR: Rheumatoid fact SerPl-aCnc: <14 IU/mL (ref <14)

## 2020-05-31 LAB — CA 125: CA 125: 11 U/mL (ref <35)

## 2020-05-31 LAB — PTH, INTACT AND CALCIUM
Calcium: 9.5 mg/dL (ref 8.6–10.4)
PTH: 26 pg/mL (ref 14–64)

## 2020-05-31 LAB — ANA: Anti Nuclear Antibody (ANA): NEGATIVE

## 2020-05-31 LAB — ANTI-DNA ANTIBODY, DOUBLE-STRANDED: ds DNA Ab: 1 IU/mL

## 2020-06-01 MED ORDER — VITAMIN D (ERGOCALCIFEROL) 1.25 MG (50000 UNIT) PO CAPS: 50000.0000 [IU] | ORAL_CAPSULE | ORAL | 0 refills | Status: DC

## 2020-06-11 ENCOUNTER — Encounter: Payer: Self-pay | Admitting: Family Medicine

## 2020-06-12 NOTE — Progress Notes (Signed)
I, Kayla Jackson, LAT, ATC, am serving as scribe for Dr. Clementeen Graham.  Kayla Jackson is a 50 y.o. female who presents to [facility] at [facility] today for new-onset R knee pain x 1 week w/ no known MOI.  She was last seen by Dr. Katrinka Blazing on 05/30/20 for L foot/heel pain and OMT for back pain.  She was last seen regarding her L knee on 01/03/20 and was provided w/ a knee brace.  Since then, pt reports new onset R knee pain and swelling x one week .  She locates her pain to her R ant-medial knee.  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    She has had several other joint pains and swellings recently.  She had a large rheumatologic work-up recently that was negative.  R knee swelling: yes particularly during the day  R knee mechanical symptoms: intermittently yes Aggravating factors: walking; prolonged standing Treatments tried: ice; 600mg  IBU; elevation   Pertinent review of systems: No fevers or chills  Relevant historical information: Otherwise healthy   Exam:  BP 130/88 (BP Location: Right Arm, Patient Position: Sitting, Cuff Size: Normal)   Pulse 77   Ht 5\' 5"  (1.651 m)   Wt 187 lb (84.8 kg)   SpO2 98%   BMI 31.12 kg/m  General: Well Developed, well nourished, and in no acute distress.   MSK: Right knee mild effusion normal motion with crepitation. Tender palpation medial joint line and lateral joint line. Stable ligamentous exam. Positive medial McMurray's test with palpable click. Intact strength.     Lab and Radiology Results  X-ray images right knee obtained today personally and independently interpreted Mild DJD.  No fractures. Await formal radiology review  Procedure: Real-time Ultrasound Guided Injection of right knee superior lateral patellar space Device: Philips Affiniti 50G Images permanently stored and available for review in PACS Evaluation with ultrasound prior to injection reveals moderate effusion.  Mildly  degenerative medial meniscus.  No Baker's cyst. Verbal informed consent obtained.  Discussed risks and benefits of procedure. Warned about infection bleeding damage to structures skin hypopigmentation and fat atrophy among others. Patient expresses understanding and agreement Time-out conducted.   Noted no overlying erythema, induration, or other signs of local infection.   Skin prepped in a sterile fashion.   Local anesthesia: Topical Ethyl chloride.   With sterile technique and under real time ultrasound guidance:  40mg  kenalog and 32ml marcaine injected into knee joint. Fluid seen entering the joint.   Completed without difficulty   Pain immediately resolved suggesting accurate placement of the medication.   Advised to call if fevers/chills, erythema, induration, drainage, or persistent bleeding.   Images permanently stored and available for review in the ultrasound unit.  Impression: Technically successful ultrasound guided injection.  Recent Results (from the past 2160 hour(s))  Angiotensin converting enzyme     Status: None   Collection Time: 05/30/20 12:29 PM  Result Value Ref Range   Angiotensin-Converting Enzyme 29 9 - 67 U/L  ANA     Status: None   Collection Time: 05/30/20 12:29 PM  Result Value Ref Range   Anti Nuclear Antibody (ANA) NEGATIVE NEGATIVE    Comment: ANA IFA is a first line screen for detecting the presence of up to approximately 150 autoantibodies in various autoimmune diseases. A negative ANA IFA result suggests an ANA-associated autoimmune disease is not present at this time, but is not definitive. If there is high clinical  suspicion for Sjogren's syndrome, testing for anti-SS-A/Ro antibody should be considered. Anti-Jo-1 antibody should be considered for clinically suspected inflammatory myopathies. . AC-0: Negative . International Consensus on ANA Patterns (SeverTies.uy) . For additional information, please refer  to http://education.[facility].com/faq/FAQ177 (This link is being provided for informational/ educational purposes only.) .   Calcium, ionized     Status: None   Collection Time: 05/30/20 12:29 PM  Result Value Ref Range   Calcium, Ion 4.97 4.8 - 5.6 mg/dL  CBC with Differential/Platelet     Status: None   Collection Time: 05/30/20 12:29 PM  Result Value Ref Range   WBC 5.3 4.0 - 10.5 K/uL   RBC 4.37 3.87 - 5.11 Mil/uL   Hemoglobin 13.4 12.0 - 15.0 g/dL   HCT 97.0 26.3 - 78.5 %   MCV 88.0 78.0 - 100.0 fl   MCHC 34.9 30.0 - 36.0 g/dL   RDW 88.5 02.7 - 74.1 %   Platelets 233.0 150.0 - 400.0 K/uL   Neutrophils Relative % 62.2 43.0 - 77.0 %   Lymphocytes Relative 27.4 12.0 - 46.0 %   Monocytes Relative 6.8 3.0 - 12.0 %   Eosinophils Relative 2.7 0.0 - 5.0 %   Basophils Relative 0.9 0.0 - 3.0 %   Neutro Abs 3.3 1.4 - 7.7 K/uL   Lymphs Abs 1.5 0.7 - 4.0 K/uL   Monocytes Absolute 0.4 0.1 - 1.0 K/uL   Eosinophils Absolute 0.1 0.0 - 0.7 K/uL   Basophils Absolute 0.1 0.0 - 0.1 K/uL  Comprehensive metabolic panel     Status: None   Collection Time: 05/30/20 12:29 PM  Result Value Ref Range   Sodium 141 135 - 145 mEq/L   Potassium 3.8 3.5 - 5.1 mEq/L   Chloride 104 96 - 112 mEq/L   CO2 30 19 - 32 mEq/L   Glucose, Bld 94 70 - 99 mg/dL   BUN 17 6 - 23 mg/dL   Creatinine, Ser 2.87 0.40 - 1.20 mg/dL   Total Bilirubin 0.4 0.2 - 1.2 mg/dL   Alkaline Phosphatase 55 39 - 117 U/L   AST 15 0 - 37 U/L   ALT 14 0 - 35 U/L   Total Protein 7.5 6.0 - 8.3 g/dL   Albumin 4.3 3.5 - 5.2 g/dL   GFR 86.76 >72.09 mL/min    Comment: Calculated using the CKD-EPI Creatinine Equation (2021)   Calcium 9.3 8.4 - 10.5 mg/dL  C-reactive protein     Status: None   Collection Time: 05/30/20 12:29 PM  Result Value Ref Range   CRP <1.0 0.5 - 20.0 mg/dL  Cyclic citrul peptide antibody, IgG     Status: None   Collection Time: 05/30/20 12:29 PM  Result Value Ref Range   Cyclic Citrullin Peptide Ab <47  UNITS    Comment: Reference Range Negative:            <20 Weak Positive:       20-39 Moderate Positive:   40-59 Strong Positive:     >59 .   IBC panel     Status: None   Collection Time: 05/30/20 12:29 PM  Result Value Ref Range   Iron 91 42 - 145 ug/dL   Transferrin 096.2 836.6 - 360.0 mg/dL   Saturation Ratios 29.4 20.0 - 50.0 %  PTH, intact and calcium     Status: None   Collection Time: 05/30/20 12:29 PM  Result Value Ref Range   PTH 26 14 - 64 pg/mL    Comment: .  Interpretive Guide    Intact PTH           Calcium ------------------    ----------           ------- Normal Parathyroid    Normal               Normal Hypoparathyroidism    Low or Low Normal    Low Hyperparathyroidism    Primary            Normal or High       High    Secondary          High                 Normal or Low    Tertiary           High                 High Non-Parathyroid    Hypercalcemia      Low or Low Normal    High .    Calcium 9.5 8.6 - 10.4 mg/dL  Rheumatoid factor     Status: None   Collection Time: 05/30/20 12:29 PM  Result Value Ref Range   Rhuematoid fact SerPl-aCnc <14 <14 IU/mL  Sedimentation rate     Status: None   Collection Time: 05/30/20 12:29 PM  Result Value Ref Range   Sed Rate 4 0 - 30 mm/hr  TSH     Status: None   Collection Time: 05/30/20 12:29 PM  Result Value Ref Range   TSH 1.33 0.35 - 4.50 uIU/mL  Uric acid     Status: None   Collection Time: 05/30/20 12:29 PM  Result Value Ref Range   Uric Acid, Serum 5.2 2.4 - 7.0 mg/dL  VITAMIN D 25 Hydroxy (Vit-D Deficiency, Fractures)     Status: Abnormal   Collection Time: 05/30/20 12:29 PM  Result Value Ref Range   VITD 24.78 (L) 30.00 - 100.00 ng/mL  T3, free     Status: None   Collection Time: 05/30/20 12:29 PM  Result Value Ref Range   T3, Free 3.2 2.3 - 4.2 pg/mL  T4, free     Status: None   Collection Time: 05/30/20 12:29 PM  Result Value Ref Range   Free T4 0.91 0.60 - 1.60 ng/dL    Comment: Specimens from  patients who are undergoing biotin therapy and /or ingesting biotin supplements may contain high levels of biotin.  The higher biotin concentration in these specimens interferes with this Free T4 assay.  Specimens that contain high levels  of biotin may cause false high results for this Free T4 assay.  Please interpret results in light of the total clinical presentation of the patient.    CEA     Status: None   Collection Time: 05/30/20 12:29 PM  Result Value Ref Range   CEA <0.5 ng/mL    Comment: Non-Smoker: <2.5 Smoker:     <5.0 . . This test was performed using the Siemens  chemiluminescent method. Values obtained from different assay methods cannot be used interchangeably. CEA levels, regardless of value, should not be interpreted as absolute evidence of the presence or absence of disease. .   CA 125     Status: None   Collection Time: 05/30/20 12:29 PM  Result Value Ref Range   CA 125 11 <35 U/mL    Comment: . This test was performed using the Beckman Coulter Chemiluminescent method. Values obtained from different assay  methods cannot be used interchangeably. CA 125 levels, regardless of value, should not be interpreted as absolute evidence of the presence or absence of disease. Laverda Page. Effective June 26, 2020, the Siemens CA 125  immunoassay will replace the previous [employer] Coulter  method. For patients followed by serial CA 125 testing, order code [id number] - CA 125, Re-baseline will be available through September 18, 2020 to assist with  transition to the new assay. .   Anti-DNA antibody, double-stranded     Status: None   Collection Time: 05/30/20 12:29 PM  Result Value Ref Range   ds DNA Ab 1 IU/mL    Comment:                            IU/mL       Interpretation                            < or = 4    Negative                            5-9         Indeterminate                            > or = 10   Positive .       Assessment and Plan: 50 y.o. female with right  knee pain and effusion.  Unclear etiology.  Patient does have minimal degenerative changes on x-ray is not clear today why she all of a sudden has worsening pain.  Plan for injection today.  Patient already had a great rheumatologic work-up that was nondiagnostic.  If not improved with the steroid injection neck step would be MRI of the knee.  Recheck back as needed.   PDMP not reviewed this encounter. Orders Placed This Encounter  Procedures  . US LIMITED JOINT SPACE STRUCTURES LOW RIGHT(NO LINKED CHARGES)    Order Specific Question:   Reason for Exam (SYMPTOM  OR DIAGNOSIS REQUIRED)    Answer:   R knee pain    Order Specific Question:   [facility]?    [facility]  . DG Knee AP/LAT W/Sunrise Right    Standing Status:   Future    Number of Occurrences:   1    Standing Expiration Date:   06/13/2021    Order Specific Question:   Reason for Exam (SYMPTOM  OR DIAGNOSIS REQUIRED)    Answer:   eval knee pain    Order Specific Question:   Is patient pregnant?    Answer:   No    Order Specific Question:   Preferred imaging location?    [facility]   No orders of the defined types were placed in this encounter.    Discussed warning signs or symptoms. Please see discharge instructions. Patient expresses understanding.   The above documentation has been reviewed and is accurate and complete Clementeen GrahamEvan Lebaron Bautch, M.D.

## 2020-06-13 ENCOUNTER — Ambulatory Visit (INDEPENDENT_AMBULATORY_CARE_PROVIDER_SITE_OTHER): Payer: No Typology Code available for payment source

## 2020-06-13 ENCOUNTER — Ambulatory Visit: Payer: Self-pay

## 2020-06-13 ENCOUNTER — Ambulatory Visit (INDEPENDENT_AMBULATORY_CARE_PROVIDER_SITE_OTHER): Payer: No Typology Code available for payment source | Admitting: Family Medicine

## 2020-06-13 ENCOUNTER — Other Ambulatory Visit: Payer: Self-pay

## 2020-06-13 ENCOUNTER — Encounter: Payer: Self-pay | Admitting: Family Medicine

## 2020-06-13 VITALS — BP 130/88 | HR 77 | Ht 65.0 in | Wt 187.0 lb

## 2020-06-13 DIAGNOSIS — M25561 Pain in right knee: Secondary | ICD-10-CM

## 2020-06-13 NOTE — Patient Instructions (Signed)
Thank you for coming in today.  Call or go to the ER if you develop a large red swollen joint with extreme pain or oozing puss.   Please use voltaren gel up to 4x daily for pain as needed.   If not better next step is MRI.

## 2020-06-14 NOTE — Progress Notes (Signed)
X-ray right knee shows moderate arthritis

## 2020-06-19 ENCOUNTER — Other Ambulatory Visit: Payer: Self-pay

## 2020-06-19 ENCOUNTER — Ambulatory Visit (INDEPENDENT_AMBULATORY_CARE_PROVIDER_SITE_OTHER): Payer: No Typology Code available for payment source | Admitting: Physical Therapy

## 2020-06-19 ENCOUNTER — Encounter: Payer: Self-pay | Admitting: Physical Therapy

## 2020-06-19 DIAGNOSIS — M25572 Pain in left ankle and joints of left foot: Secondary | ICD-10-CM | POA: Diagnosis not present

## 2020-06-19 NOTE — Therapy (Signed)
[facility] [address] [place], [place], [address] Phone: [phone]   Fax:  [phone]  Physical Therapy Evaluation  Patient Details  Name: Kayla Jackson MRN: [id number] Date of Birth: [birth year] Referring Provider (PT): Terrilee Files   Encounter Date: 06/19/2020   PT End of Session - 06/19/20 1459    Visit Number 1    Number of Visits 12    Date for PT Re-Evaluation 07/31/20    Authorization Type Aetna    PT Start Time 1015    PT Stop Time 1055    PT Time Calculation (min) 40 min    Activity Tolerance Patient tolerated treatment well    Behavior During Therapy [facility] for tasks assessed/performed           History reviewed. No pertinent past medical history.  Past Surgical History:  Procedure Laterality Date  . BREAST CYST ASPIRATION Left    calcified milk duct  . CESAREAN SECTION      There were no vitals filed for this visit.    Subjective Assessment - 06/19/20 1454    Subjective Pt states increased pain in L foot for about 3 months. Has not had previous foot pain. Pt works as Teacher, early years/pre, stands for long hours. States in last 3 months, she hsa gained 30 lb , also having work up for Rheumatoid, due to widespread pain. She is wearing good sneakers, Brooks Adrenaline. Was referred to PT for orthotics for foot pain. Pt also recently with significant R knee pain, did get injection recently, that has solved that. Thinks injection has also helped foot pain, is feeling a bit better today.    Limitations Standing;Walking    Patient Stated Goals Decreased pain    Currently in Pain? Yes    Pain Score 6     Pain Location Foot    Pain Orientation Left    Pain Descriptors / Indicators Aching    Pain Type Acute pain    Pain Onset More than a month ago    Pain Frequency Intermittent              [facility] PT Assessment - 06/19/20 0001      Assessment   Medical Diagnosis L foot pain    Referring Provider (PT) Terrilee Files    Prior  Therapy no      Precautions   Precautions None      Balance Screen   Has the patient fallen in the past 6 months No      Prior Function   Level of Independence Independent      Cognition   Overall Cognitive Status Within Functional Limits for tasks assessed      Posture/Postural Control   Posture Comments Standing: foot posture: mild decrease of arch height bilaterally      ROM / Strength   AROM / PROM / Strength AROM;Strength      AROM   Overall AROM Comments Mod limitation for DF bilaterally , Hips, Knees: WNL      Strength   Overall Strength Comments Ankle: 4/5 , Knee/Hips: 4/5 gross      Palpation   Palpation comment Pain in base of L heel, into medial arch,  TIghtness and limitation in bil gastrocs                      Objective measurements completed on examination: See above findings.       [facility] Adult PT Treatment/Exercise - 06/19/20 0001  Exercises   Exercises Ankle      Manual Therapy   Manual Therapy Joint mobilization;Soft tissue mobilization;Passive ROM    Soft tissue mobilization DTM to L gastroc and achilles    Passive ROM Manual Stretching for L DF.      Ankle Exercises: Stretches   Plantar Fascia Stretch 5 reps;10 seconds    Plantar Fascia Stretch Limitations self, seated    Soleus Stretch 5 reps;10 seconds    Soleus Stretch Limitations at wall    Gastroc Stretch 3 reps;30 seconds    Gastroc Stretch Limitations at wall                  PT Education - 06/19/20 1459    Education Details PT POC, Exam findings, HEP, Discussed orthotic process    Person(s) Educated Patient    Methods Explanation;Demonstration;Tactile cues;Verbal cues;Handout    Comprehension Verbalized understanding;Returned demonstration;Verbal cues required;Tactile cues required;Need further instruction            PT Short Term Goals - 06/19/20 1501      PT SHORT TERM GOAL #1   Title Pt to be independent wtih inital HEP    Time 2    Period  Weeks    Status New    Target Date 07/03/20             PT Long Term Goals - 06/19/20 1502      PT LONG TERM GOAL #1   Title Pt to be independent with final HEP    Time 6    Period Weeks    Status New    Target Date 07/31/20      PT LONG TERM GOAL #2   Title Pt to report decreased pain in L heel to 0-2/10 with activity and work duties    Time 6    Period Weeks    Status New    Target Date 07/31/20      PT LONG TERM GOAL #3   Title Pt to be compliant with footwear and/or orthotics for foot pain    Time 6    Period Weeks    Status New    Target Date 07/31/20                  Plan - 06/19/20 1503    Clinical Impression Statement Pt presents with primary complaint of increased pain in L foot. Pt with pain and tenderness in base of L heel and  plantar fascia. She does have tightness and limitation for DF bilaterally. Pt with mild arch height loss in standing. Pt with decreased ability for full functional activities due to pain, and much difficulty with standing/work duties due to pain. Pt to benefit from skilled PT to improve deficits and pain. Pt also to benefit from orthotic fitting to improve pressure relief and pain. Plan to make orthotics at next visit.    Examination-Activity Limitations Stand;Stairs    Examination-Participation Restrictions Cleaning;Yard Work;Occupation;Community Activity;Shop    Stability/Clinical Decision Making Stable/Uncomplicated    Clinical Decision Making Low    Rehab Potential Good    PT Frequency 2x / week    PT Duration 6 weeks    PT Treatment/Interventions ADLs/Self Care Home Management;Cryotherapy;Electrical Stimulation;Iontophoresis 4mg /ml Dexamethasone;Moist Heat;Traction;Ultrasound;Therapeutic exercise;DME Instruction;Gait training;Stair training;Functional mobility training;Therapeutic activities;Orthotic Fit/Training;Patient/family education;Neuromuscular re-education;Balance training;Manual techniques;Vasopneumatic  Device;Taping;Splinting;Dry needling;Passive range of motion;Spinal Manipulations;Joint Manipulations    PT Home Exercise Plan [email or web address]    Consulted and Agree with Plan of Care Patient  Patient will benefit from skilled therapeutic intervention in order to improve the following deficits and impairments:  Pain,Decreased mobility,Increased muscle spasms,Decreased strength,Decreased range of motion,Decreased activity tolerance,Difficulty walking,Impaired flexibility  Visit Diagnosis: Pain in left ankle and joints of left foot     Problem List Patient Active Problem List   Diagnosis Date Noted  . Plantar fasciitis of left foot 03/29/2020  . Patellar subluxation, left, initial encounter 01/03/2020  . Neck pain 06/24/2019  . Nonallopathic lesion of cervical region 06/24/2019  . Nonallopathic lesion of rib cage 06/24/2019  . Right hip impingement syndrome 02/24/2018  . SI (sacroiliac) joint dysfunction 02/24/2018  . Nonallopathic lesion of sacral region 02/24/2018  . Nonallopathic lesion of lumbosacral region 02/24/2018  . Nonallopathic lesion of thoracic region 02/24/2018    Sedalia Muta, PT, DPT 3:17 PM  06/19/20    [facility] [address] [place], [place], [address] Phone: [phone]   Fax:  [phone]  Name: Kayla Jackson MRN: [id number] Date of Birth: November 21, 1970

## 2020-06-19 NOTE — Patient Instructions (Signed)
Access Code: [email or web address] URL: [email or web address] Date: 06/19/2020 Prepared by: Sedalia Muta  Exercises Gastroc Stretch on Wall - 2 x daily - 3 reps - 30 hold Standing Soleus Stretch at Counter - 2 x daily - 10 reps - 30 hold Seated Plantar Fascia Stretch - 2 x daily - 2 sets - 10 reps

## 2020-06-26 ENCOUNTER — Encounter: Payer: No Typology Code available for payment source | Admitting: Physical Therapy

## 2020-06-27 ENCOUNTER — Other Ambulatory Visit: Payer: Self-pay

## 2020-06-27 ENCOUNTER — Ambulatory Visit (INDEPENDENT_AMBULATORY_CARE_PROVIDER_SITE_OTHER): Payer: No Typology Code available for payment source | Admitting: Physical Therapy

## 2020-06-27 ENCOUNTER — Encounter: Payer: Self-pay | Admitting: Physical Therapy

## 2020-06-27 DIAGNOSIS — M25572 Pain in left ankle and joints of left foot: Secondary | ICD-10-CM | POA: Diagnosis not present

## 2020-06-27 NOTE — Patient Instructions (Signed)
Access Code: [email or web address] URL: [email or web address] Date: 06/27/2020 Prepared by: Sedalia Muta  Exercises Gastroc Stretch on Wall - 2 x daily - 3 reps - 30 hold Standing Soleus Stretch at Counter - 2 x daily - 10 reps - 30 hold Seated Plantar Fascia Stretch - 2 x daily - 2 sets - 10 reps Seated Knee Extension AROM - 1 x daily - 2 sets - 10 reps Straight Leg Raise - 1 x daily - 2 sets - 10 reps Sidelying Hip Abduction - 1 x daily - 2 sets - 10 reps Supine Bridge - 1 x daily - 2 sets - 10 reps

## 2020-06-27 NOTE — Progress Notes (Signed)
[name] [address] [place] [address] Phone: [phone] Subjective:   Bruce Donath, am serving as a scribe for Dr. Antoine Primas. This visit occurred during the SARS-CoV-2 public health emergency.  Safety protocols were in place, including screening questions prior to the visit, additional usage of staff PPE, and extensive cleaning of exam room while observing appropriate contact time as indicated for disinfecting solutions.   I'm seeing this patient by the request  of:  Shirlean Mylar, MD  CC: back pain and knee pain follow up   XYI:[id number]   05/30/2020 Patient has made some improvement but still not significant enough.  We discussed potential injection with patient declined.  We discussed which activities to do which wants to avoid.  Patient is having worsening pain and weight gain.  We will get laboratory work-up to see if anything else is contributing with patient's strong family history of rheumatoid arthritis in both mother and father.  We will check on patient's custom orthotics.  Patient was referred and I do think very will be beneficial.  If continued pain will consider injections.   Update 06/28/2020 Mariavictoria Quinney is a 50 y.o. female coming in with complaint of back and neck pain, left heel and right knee pain. Saw Dr. Denyse Amass on 06/13/2020 for knee pain. Patient states that injection from Dr. Denyse Amass worked until this past weekend. Pain over medial aspect. Had less pain in back and foot after knee injection. Notes swelling at night.   Heel pain has improved. Has custom orthotics and doing stretches which is   Medications patient has been prescribed:   Taking:  Patient is also noticing with the low back does not hurt as much but unfortunately if she extends her hands above her head she does still have   For the knee been to PT  Given injection in the knee 06/13/20 was improved for approximately 1 week and now having worsening pain  again.   Reviewed prior external information including notes and imaging from previsou exam, outside providers and external EMR if available.   As well as notes that were available from care everywhere and other healthcare systems.  Past medical history, social, surgical and family history all reviewed in electronic medical record.  No pertanent information unless stated regarding to the chief complaint.   No past medical history on file.  No Known Allergies   Review of Systems:  No headache, visual changes, nausea, vomiting, diarrhea, constipation, dizziness, abdominal pain, skin rash, fevers, chills, night sweats, weight loss, swollen lymph nodes, body aches, joint swelling, chest pain, shortness of breath, mood changes. POSITIVE muscle aches  Objective  Blood pressure 118/88, pulse 88, height 5\' 5"  (1.651 m), weight 183 lb (83 kg), SpO2 98 %.   General: No apparent distress alert and oriented x3 mood and affect normal, dressed appropriately.  HEENT: Pupils equal, extraocular movements intact  Respiratory: Patient's speak in full sentences and does not appear short of breath  Cardiovascular: No lower extremity edema, non tender, no erythema .  Gait antalgic MSK: Right knee exam shows the patient does have tenderness to palpation over the medial joint space.  Fairly severe and does have some mild swelling.  Mild swelling also noted of the patellofemoral joint noted.  Lacks last 5 degrees of flexion.  Back exam fairly unremarkable except with increased extension patient does have worsening pain.  Mild tightness noted with hamstring as well.  Mild radicular pain patient states but no true  weakness noted.  Seems to be more in the L5 and S1 distribution.  Deep tendon reflexes do appear to be intact  Limited musculoskeletal ultrasound was performed and interpreted [name]  Limited ultrasound of patient's knee shows that medially patient does have either swelling or potential.   Meniscal cyst.  Fairly large overall.  No true tear noted of the meniscus but mild displacement noted.  Patient does have a trace effusion of the patellofemoral joint noted as well. Impression: Questionable meniscal tear and possible parameniscal cyst         Assessment and Plan:    N      The above documentation has been reviewed and is accurate and complete [name], DO       Note: This dictation was prepared with Dragon dictation along with smaller phrase technology. Any transcriptional errors that result from this process are unintentional.

## 2020-06-27 NOTE — Therapy (Signed)
[facility] [address] [place], [place], [address] Phone: [phone]   Fax:  [phone]  Physical Therapy Treatment  Patient Details  Name: Kayla Jackson MRN: [id number] Date of Birth: 02-22-1971 Referring Provider (PT): Terrilee Files   Encounter Date: 06/27/2020   PT End of Session - 06/27/20 1211    Visit Number 2    Number of Visits 12    Date for PT Re-Evaluation 07/31/20    Authorization Type Aetna    PT Start Time 1015    PT Stop Time 1100    PT Time Calculation (min) 45 min    Activity Tolerance Patient tolerated treatment well    Behavior During Therapy [facility] for tasks assessed/performed           History reviewed. No pertinent past medical history.  Past Surgical History:  Procedure Laterality Date  . BREAST CYST ASPIRATION Left    calcified milk duct  . CESAREAN SECTION      There were no vitals filed for this visit.   Subjective Assessment - 06/27/20 1202    Subjective Pt states foot pain improving. She has been stretching and wearing compression sleeve on foot. R knee has been increasingly painful in last few days.    Currently in Pain? Yes    Pain Score 3     Pain Location Foot    Pain Orientation Left    Pain Descriptors / Indicators Aching    Pain Type Acute pain    Pain Onset More than a month ago    Pain Frequency Intermittent                             [facility] Adult PT Treatment/Exercise - 06/27/20 0001      Self-Care   Self-Care Other Self-Care Comments    Other Self-Care Comments  fitted with custom orthotics, next step sz 8                    PT Short Term Goals - 06/19/20 1501      PT SHORT TERM GOAL #1   Title Pt to be independent wtih inital HEP    Time 2    Period Weeks    Status New    Target Date 07/03/20             PT Long Term Goals - 06/19/20 1502      PT LONG TERM GOAL #1   Title Pt to be independent with final HEP    Time 6    Period  Weeks    Status New    Target Date 07/31/20      PT LONG TERM GOAL #2   Title Pt to report decreased pain in L heel to 0-2/10 with activity and work duties    Time 6    Period Weeks    Status New    Target Date 07/31/20      PT LONG TERM GOAL #3   Title Pt to be compliant with footwear and/or orthotics for foot pain    Time 6    Period Weeks    Status New    Target Date 07/31/20                 Plan - 06/27/20 1212    Clinical Impression Statement Pt fitted with custom orthotics today, Good fit and comfort achieved today. Pt educated on use, precautions, and  wear time. Also reviewed HEP for foot and for knee pain. Plan to continue manual and ther ex for pain relief next visit, and do orthotic check as needed.    Examination-Activity Limitations Stand;Stairs    Examination-Participation Restrictions Cleaning;Yard Work;Occupation;Community Activity;Shop    Stability/Clinical Decision Making Stable/Uncomplicated    Rehab Potential Good    PT Frequency 2x / week    PT Duration 6 weeks    PT Treatment/Interventions ADLs/Self Care Home Management;Cryotherapy;Electrical Stimulation;Iontophoresis 4mg /ml Dexamethasone;Moist Heat;Traction;Ultrasound;Therapeutic exercise;DME Instruction;Gait training;Stair training;Functional mobility training;Therapeutic activities;Orthotic Fit/Training;Patient/family education;Neuromuscular re-education;Balance training;Manual techniques;Vasopneumatic Device;Taping;Splinting;Dry needling;Passive range of motion;Spinal Manipulations;Joint Manipulations    PT Home Exercise Plan [id number]    Consulted and Agree with Plan of Care Patient           Patient will benefit from skilled therapeutic intervention in order to improve the following deficits and impairments:  Pain,Decreased mobility,Increased muscle spasms,Decreased strength,Decreased range of motion,Decreased activity tolerance,Difficulty walking,Impaired flexibility  Visit Diagnosis: Pain in  left ankle and joints of left foot     Problem List Patient Active Problem List   Diagnosis Date Noted  . Plantar fasciitis of left foot 03/29/2020  . Patellar subluxation, left, initial encounter 01/03/2020  . Neck pain 06/24/2019  . Nonallopathic lesion of cervical region 06/24/2019  . Nonallopathic lesion of rib cage 06/24/2019  . Right hip impingement syndrome 02/24/2018  . SI (sacroiliac) joint dysfunction 02/24/2018  . Nonallopathic lesion of sacral region 02/24/2018  . Nonallopathic lesion of lumbosacral region 02/24/2018  . Nonallopathic lesion of thoracic region 02/24/2018   13/03/2018, PT, DPT 1:30 PM  06/27/20    [facility] [address] [place], [place], [place] Phone: [phone]   Fax:  [phone]  Name: Kayla Jackson MRN: Chauncey Reading Date of Birth: 1970/12/18

## 2020-06-28 ENCOUNTER — Ambulatory Visit (INDEPENDENT_AMBULATORY_CARE_PROVIDER_SITE_OTHER): Payer: No Typology Code available for payment source | Admitting: Family Medicine

## 2020-06-28 ENCOUNTER — Ambulatory Visit: Payer: Self-pay

## 2020-06-28 ENCOUNTER — Encounter: Payer: Self-pay | Admitting: Family Medicine

## 2020-06-28 VITALS — BP 118/88 | HR 88 | Ht 65.0 in | Wt 183.0 lb

## 2020-06-28 DIAGNOSIS — M25561 Pain in right knee: Secondary | ICD-10-CM | POA: Diagnosis not present

## 2020-06-28 DIAGNOSIS — M545 Low back pain, unspecified: Secondary | ICD-10-CM

## 2020-06-28 DIAGNOSIS — G8929 Other chronic pain: Secondary | ICD-10-CM

## 2020-06-28 DIAGNOSIS — M5416 Radiculopathy, lumbar region: Secondary | ICD-10-CM | POA: Insufficient documentation

## 2020-06-28 DIAGNOSIS — M79672 Pain in left foot: Secondary | ICD-10-CM | POA: Diagnosis not present

## 2020-06-28 NOTE — Patient Instructions (Signed)
Good to see you MRI lumbar MRI right knee See me again in 6 weeks we will talk before then

## 2020-06-28 NOTE — Assessment & Plan Note (Signed)
Patient is having a new symptom.  Patient states that any type of extension of back causes radicular symptoms down the back.  Seems to be more in the S1 distribution.  Patient states it is becoming more frequent.  Denies any weakness though.  Patient does not know if it is secondary to her back which did on x-ray have more arthritic changes.  Patient has done formal physical therapy, home exercises, manipulation and multiple different medications including gabapentin.  Worsening pain at this time I do feel advanced imaging is warranted.  Patient could be a candidate for an epidural.  Patient's mother is already scheduled to have a spinal fusion secondary to severe spinal stenosis.

## 2020-06-28 NOTE — Assessment & Plan Note (Signed)
Patient does have right knee pain.  He did have a small effusion noted and some mild to moderate arthritic changes of the patellofemoral joint.  Patient on the medial aspect appears to have a medial meniscal tear but also a potential parameniscal cyst.  Possible even a medial plica.  Because of this and patient pain coming back with me 2 weeks after her injection from another provider concerned that there is something deeper in the knee that is causing internal derangement.  Will need further evaluation with MRI.  Patient would be a surgical candidate if necessary.  Follow-up with me after imaging.

## 2020-07-06 ENCOUNTER — Other Ambulatory Visit: Payer: Self-pay

## 2020-07-06 ENCOUNTER — Ambulatory Visit (INDEPENDENT_AMBULATORY_CARE_PROVIDER_SITE_OTHER): Payer: No Typology Code available for payment source | Admitting: Physical Therapy

## 2020-07-06 ENCOUNTER — Encounter: Payer: Self-pay | Admitting: Physical Therapy

## 2020-07-06 DIAGNOSIS — M25572 Pain in left ankle and joints of left foot: Secondary | ICD-10-CM | POA: Diagnosis not present

## 2020-07-06 NOTE — Therapy (Addendum)
[facility] [address] [place], [place], [address] Phone: [phone]   Fax:  [phone]  Physical Therapy Treatment  Patient Details  Name: Kayla Jackson MRN: [id number] Date of Birth: April 30, 1970 Referring Provider (PT): Charlann Boxer   Encounter Date: 07/06/2020   PT End of Session - 07/06/20 1507    Visit Number 3    Number of Visits 12    Date for PT Re-Evaluation 07/31/20    Authorization Type Aetna    PT Start Time [phone]    PT Stop Time 0930    PT Time Calculation (min) 44 min    Activity Tolerance Patient tolerated treatment well    Behavior During Therapy [facility] for tasks assessed/performed           History reviewed. No pertinent past medical history.  Past Surgical History:  Procedure Laterality Date  . BREAST CYST ASPIRATION Left    calcified milk duct  . CESAREAN SECTION      There were no vitals filed for this visit.   Subjective Assessment - 07/06/20 1504    Subjective Pt having much difficulty and pain in R knee. L heel still mildly sore, but continues to improve. She is wearing orthotics, up to at least 6 hours.Feels the fit makes the toe box tighter in her sneakers.    Currently in Pain? Yes    Pain Score 3     Pain Location Foot    Pain Orientation Left    Pain Descriptors / Indicators Aching    Pain Type Acute pain    Pain Onset More than a month ago    Pain Frequency Intermittent                             [facility] Adult PT Treatment/Exercise - 07/06/20 0001      Posture/Postural Control   Posture Comments Standing: foot posture: mild decrease of arch height bilaterally      Exercises   Exercises Ankle      Manual Therapy   Manual Therapy Joint mobilization;Soft tissue mobilization;Passive ROM    Joint Mobilization TCJ mobs, post, to increase DF.    Soft tissue mobilization DTM and IASM to L gastroc, achilles and pl fascia    Passive ROM Manual Stretching for L DF.       Ankle Exercises: Stretches   Plantar Fascia Stretch Limitations education for HEP    Soleus Stretch 5 reps;10 seconds    Soleus Stretch Limitations review for HEP    Other Stretch DF glides with BlTB on step x 25 on L;      Ankle Exercises: Standing   Heel Raises 20 reps    Toe Raise --   eccentric lowering                   PT Short Term Goals - 06/19/20 1501      PT SHORT TERM GOAL #1   Title Pt to be independent wtih inital HEP    Time 2    Period Weeks    Status New    Target Date 07/03/20             PT Long Term Goals - 06/19/20 1502      PT LONG TERM GOAL #1   Title Pt to be independent with final HEP    Time 6    Period Weeks    Status New  Target Date 07/31/20      PT LONG TERM GOAL #2   Title Pt to report decreased pain in L heel to 0-2/10 with activity and work duties    Time 6    Period Weeks    Status New    Target Date 07/31/20      PT LONG TERM GOAL #3   Title Pt to be compliant with footwear and/or orthotics for foot pain    Time 6    Period Weeks    Status New    Target Date 07/31/20                 Plan - 07/06/20 1508    Clinical Impression Statement Pt with improving heel pain. Does continue to have stiffness in ankle, and limitations in surrounding musculature that are likley effecting pain. Pt to benefit from continued care to improve these deficits and pain. Will continue to asess orthotic comfort, and will alter as needed. Reviewed and updated HEP.    Examination-Activity Limitations Stand;Stairs    Examination-Participation Restrictions Cleaning;Yard Work;Occupation;Community Activity;Shop    Stability/Clinical Decision Making Stable/Uncomplicated    Rehab Potential Good    PT Frequency 2x / week    PT Duration 6 weeks    PT Treatment/Interventions ADLs/Self Care Home Management;Cryotherapy;Electrical Stimulation;Iontophoresis 68m/ml Dexamethasone;Moist Heat;Traction;Ultrasound;Therapeutic exercise;DME  Instruction;Gait training;Stair training;Functional mobility training;Therapeutic activities;Orthotic Fit/Training;Patient/family education;Neuromuscular re-education;Balance training;Manual techniques;Vasopneumatic Device;Taping;Splinting;Dry needling;Passive range of motion;Spinal Manipulations;Joint Manipulations    PT Home Exercise Plan [id number]    Consulted and Agree with Plan of Care Patient           Patient will benefit from skilled therapeutic intervention in order to improve the following deficits and impairments:  Pain,Decreased mobility,Increased muscle spasms,Decreased strength,Decreased range of motion,Decreased activity tolerance,Difficulty walking,Impaired flexibility  Visit Diagnosis: Pain in left ankle and joints of left foot     Problem List Patient Active Problem List   Diagnosis Date Noted  . Lumbar radiculopathy 06/28/2020  . Right knee pain 06/28/2020  . Plantar fasciitis of left foot 03/29/2020  . Patellar subluxation, left, initial encounter 01/03/2020  . Neck pain 06/24/2019  . Nonallopathic lesion of cervical region 06/24/2019  . Nonallopathic lesion of rib cage 06/24/2019  . Right hip impingement syndrome 02/24/2018  . SI (sacroiliac) joint dysfunction 02/24/2018  . Nonallopathic lesion of sacral region 02/24/2018  . Nonallopathic lesion of lumbosacral region 02/24/2018  . Nonallopathic lesion of thoracic region 02/24/2018    LLyndee Hensen PT, DPT 3:11 PM  07/06/20    [place] [address]Phone: [phone]  Fax:  [phone] Name: Kayla NichelsonMRN: [id number] of Birth: 105-Feb-1972 PHYSICAL THERAPY DISCHARGE SUMMARY  Visits from Start of Care: 3 Plan: Patient agrees to discharge.  Patient goals were partially met. Patient is being discharged due to not returning since the last visit.  ?????      LLyndee Hensen PT, DPT 9:48 AM  09/01/20

## 2020-07-12 ENCOUNTER — Encounter: Payer: No Typology Code available for payment source | Admitting: Physical Therapy

## 2020-07-16 ENCOUNTER — Ambulatory Visit
Admission: RE | Admit: 2020-07-16 | Discharge: 2020-07-16 | Disposition: A | Discharge status: Home / Self Care | Payer: No Typology Code available for payment source | Source: Ambulatory Visit | Attending: Family Medicine | Admitting: Family Medicine

## 2020-07-16 ENCOUNTER — Other Ambulatory Visit: Payer: Self-pay

## 2020-07-16 DIAGNOSIS — G8929 Other chronic pain: Secondary | ICD-10-CM

## 2020-07-17 ENCOUNTER — Telehealth: Payer: Self-pay | Admitting: Family Medicine

## 2020-07-17 ENCOUNTER — Encounter: Payer: Self-pay | Admitting: Family Medicine

## 2020-07-17 ENCOUNTER — Other Ambulatory Visit: Payer: Self-pay

## 2020-07-17 DIAGNOSIS — G8929 Other chronic pain: Secondary | ICD-10-CM

## 2020-07-17 NOTE — Telephone Encounter (Signed)
Noted  

## 2020-07-17 NOTE — Telephone Encounter (Signed)
[facility] called with a call report for the patient for her MR Lumbar Spine.  IMPRESSION: 1. Degenerative change at L4-L5 with grade 1 anterolisthesis. Resulting severe right greater than left subarticular recess stenosis with suspected impingement of the descending right L5 nerve. Moderate central canal and right foraminal stenosis. Mild left foraminal stenosis. 2. Mild edema involving bilateral L4 pedicles with suggestion of linear T1 hypointensity in these regions, suggestive of pars defects/stress fractures. A CT of the lumbar spine could further characterize if clinically indicated. 3. Multilevel mild foraminal and subarticular recess stenosis is detailed above.

## 2020-07-18 ENCOUNTER — Encounter: Payer: Self-pay | Admitting: Family Medicine

## 2020-07-26 ENCOUNTER — Ambulatory Visit (INDEPENDENT_AMBULATORY_CARE_PROVIDER_SITE_OTHER)
Admission: RE | Admit: 2020-07-26 | Discharge: 2020-07-26 | Disposition: A | Discharge status: Home / Self Care | Payer: No Typology Code available for payment source | Source: Ambulatory Visit | Attending: Family Medicine | Admitting: Family Medicine

## 2020-07-26 ENCOUNTER — Other Ambulatory Visit: Payer: Self-pay

## 2020-07-26 DIAGNOSIS — M545 Low back pain, unspecified: Secondary | ICD-10-CM

## 2020-07-26 DIAGNOSIS — G8929 Other chronic pain: Secondary | ICD-10-CM | POA: Diagnosis not present

## 2020-08-06 ENCOUNTER — Encounter: Payer: Self-pay | Admitting: Family Medicine

## 2020-08-08 NOTE — Progress Notes (Signed)
[name] [address] [place] [address] Phone: [phone] Subjective:   Bruce Donath, am serving as a scribe for Dr. Antoine Primas. This visit occurred during the SARS-CoV-2 public health emergency.  Safety protocols were in place, including screening questions prior to the visit, additional usage of staff PPE, and extensive cleaning of exam room while observing appropriate contact time as indicated for disinfecting solutions.   I'm seeing this patient by the request  of:  Shirlean Mylar, MD  CC: Right knee pain, back pain  OQH:[id number]   06/28/2020 Patient does have right knee pain.  He did have a small effusion noted and some mild to moderate arthritic changes of the patellofemoral joint.  Patient on the medial aspect appears to have a medial meniscal tear but also a potential parameniscal cyst.  Possible even a medial plica.  Because of this and patient pain coming back with me 2 weeks after her injection from another provider concerned that there is something deeper in the knee that is causing internal derangement.  Will need further evaluation with MRI.  Patient would be a surgical candidate if necessary.  Follow-up with me after imaging.  Patient is having a new symptom.  Patient states that any type of extension of back causes radicular symptoms down the back.  Seems to be more in the S1 distribution.  Patient states it is becoming more frequent.  Denies any weakness though.  Patient does not know if it is secondary to her back which did on x-ray have more arthritic changes.  Patient has done formal physical therapy, home exercises, manipulation and multiple different medications including gabapentin.  Worsening pain at this time I do feel advanced imaging is warranted.  Patient could be a candidate for an epidural.  Patient's mother is already scheduled to have a spinal fusion secondary to severe spinal stenosis.  Update 08/09/2020 Kayla Jackson  is a 50 y.o. female coming in with complaint of back, right knee pain and L heel pain. Patient states that her pain is over medial aspect of knee. Swelling in this area. Patient twisted knee Sunday and had a lot of swelling. Pain has improved since then. Riding a bike doesn't bother her but walking does increased pain.   Back pain has increased due to antalgic gait secondary to knee pain. Also continues to have pain in L heel with standing. Does have some relief when walking in house with [employer] sandals.   MRI lumbar 07/16/2020 IMPRESSION: 1. Degenerative change at L4-L5 with grade 1 anterolisthesis. Resulting severe right greater than left subarticular recess stenosis with suspected impingement of the descending right L5 nerve. Moderate central canal and right foraminal stenosis. Mild left foraminal stenosis. 2. Mild edema involving bilateral L4 pedicles with suggestion of linear T1 hypointensity in these regions, suggestive of pars defects/stress fractures. A CT of the lumbar spine could further characterize if clinically indicated. 3. Multilevel mild foraminal and subarticular recess stenosis is detailed above.  MRI R knee 07/16/2020  IMPRESSION: 1. Patellar tendon-lateral femoral condyle friction syndrome. 2. Osteoarthritis of the knee most striking in the medial compartment and laterally in the patellofemoral joint. 3. Small knee effusion with thin medial plica. Very small Baker's cyst.    No past medical history on file. Past Surgical History:  Procedure Laterality Date  . BREAST CYST ASPIRATION Left    calcified milk duct  . CESAREAN SECTION     Social History   Socioeconomic History  . Marital  status: Married    Spouse name: Not on file  . Number of children: Not on file  . Years of education: Not on file  . Highest education level: Not on file  Occupational History  . Not on file  Tobacco Use  . Smoking status: Never Smoker  . Smokeless tobacco: Never Used  Vaping  Use  . Vaping Use: Never used  Substance and Sexual Activity  . Alcohol use: Yes    Comment: occ  . Drug use: No  . Sexual activity: Not on file  Other Topics Concern  . Not on file  Social History Narrative  . Not on file   Social Determinants of Health   Financial Resource Strain: Not on file  Food Insecurity: Not on file  Transportation Needs: Not on file  Physical Activity: Not on file  Stress: Not on file  Social Connections: Not on file   No Known Allergies No family history on file.   Current Outpatient Medications (Cardiovascular):  .  nitroGLYCERIN (NITRO-DUR) 0.1 mg/hr patch, 1/4 patch daily   Current Outpatient Medications (Analgesics):  .  ibuprofen (ADVIL) 600 MG tablet, Take 600 mg by mouth daily.   Current Outpatient Medications (Other):  .  gabapentin (NEURONTIN) 100 MG capsule, Take 2 capsules (200 mg total) by mouth at bedtime. .  Vitamin D, Ergocalciferol, (DRISDOL) 1.25 MG (50000 UNIT) CAPS capsule, Take 1 capsule (50,000 Units total) by mouth every 7 (seven) days.   Reviewed prior external information including notes and imaging from  primary care provider As well as notes that were available from care everywhere and other healthcare systems.  Past medical history, social, surgical and family history all reviewed in electronic medical record.  No pertanent information unless stated regarding to the chief complaint.   Review of Systems:  No headache, visual changes, nausea, vomiting, diarrhea, constipation, dizziness, abdominal pain, skin rash, fevers, chills, night sweats, weight loss, swollen lymph nodes, body aches, joint swelling, chest pain, shortness of breath, mood changes. POSITIVE muscle aches  Objective  Blood pressure 112/78, pulse 76, height 5\' 5"  (1.651 m), weight 186 lb (84.4 kg), SpO2 98 %.   General: No apparent distress alert and oriented x3 mood and affect normal, dressed appropriately.  HEENT: Pupils equal, extraocular  movements intact  Respiratory: Patient's speak in full sentences and does not appear short of breath  Cardiovascular: No lower extremity edema, non tender, no erythema  Gait normal with good balance and coordination.  MSK:   Right knee exam still has some tenderness to palpation over the medial joint space.  Patient does have unfortunately swelling noted of the patellofemoral joint.  Lateral tracking of the patella noted.  Tenderness over the medial joint space is probably worse though.  Lacks last 5 degrees of flexion.  Contralateral knee is unremarkable.  Low back exam does have some mild loss of lordosis.  Some tenderness to palpation in the paraspinal musculature around the L4-S1 bilaterally.  No spinous process tenderness.  Mild increased discomfort with extension of the back.  Tightness with straight leg test but no true radicular symptoms today.  Deep tendon reflexes are intact.  Osteopathic findings C6 flexed rotated and side bent right T6 extended rotated and side bent right with inhaled rib L3 flexed rotated and side bent left  L4 flexed rotated and side bent right Sacrum right on right  Limited musculoskeletal ultrasound was performed and interpreted by  Limited ultrasound of patient's right knee shows the patient  does have still effusion noted of the patellofemoral joint noted.  No new findings that would be consistent with a meniscal injury.  Patient still has a cortical irregularity of the medial tibial area.   Impression and Recommendations:     The above documentation has been reviewed and is accurate and complete [name], DO

## 2020-08-09 ENCOUNTER — Encounter: Payer: Self-pay | Admitting: Family Medicine

## 2020-08-09 ENCOUNTER — Other Ambulatory Visit: Payer: Self-pay

## 2020-08-09 ENCOUNTER — Ambulatory Visit: Payer: No Typology Code available for payment source | Admitting: Family Medicine

## 2020-08-09 ENCOUNTER — Ambulatory Visit (INDEPENDENT_AMBULATORY_CARE_PROVIDER_SITE_OTHER): Payer: No Typology Code available for payment source | Admitting: Family Medicine

## 2020-08-09 ENCOUNTER — Ambulatory Visit: Payer: Self-pay

## 2020-08-09 VITALS — BP 112/78 | HR 76 | Ht 65.0 in | Wt 186.0 lb

## 2020-08-09 DIAGNOSIS — M25561 Pain in right knee: Secondary | ICD-10-CM

## 2020-08-09 DIAGNOSIS — M9901 Segmental and somatic dysfunction of cervical region: Secondary | ICD-10-CM

## 2020-08-09 DIAGNOSIS — M9908 Segmental and somatic dysfunction of rib cage: Secondary | ICD-10-CM

## 2020-08-09 DIAGNOSIS — M5416 Radiculopathy, lumbar region: Secondary | ICD-10-CM

## 2020-08-09 DIAGNOSIS — M9902 Segmental and somatic dysfunction of thoracic region: Secondary | ICD-10-CM | POA: Diagnosis not present

## 2020-08-09 DIAGNOSIS — M9903 Segmental and somatic dysfunction of lumbar region: Secondary | ICD-10-CM

## 2020-08-09 DIAGNOSIS — M9904 Segmental and somatic dysfunction of sacral region: Secondary | ICD-10-CM | POA: Diagnosis not present

## 2020-08-09 MED ORDER — VITAMIN D (ERGOCALCIFEROL) 1.25 MG (50000 UNIT) PO CAPS: 50000.0000 [IU] | ORAL_CAPSULE | ORAL | 0 refills | Status: AC

## 2020-08-09 NOTE — Assessment & Plan Note (Signed)
PRP done today.  Tolerated the procedure well, discussed icing regimen and home exercises, discussed which activities to do which wants to avoid patient given any post PRP injection Hande.  We will hold patient seated work for at least the next 2 if not 4 weeks.  Patient will follow up with me again in 4 to 6 weeks

## 2020-08-09 NOTE — Patient Instructions (Signed)
No ice or IBU for next 3 days See me as scheduled

## 2020-08-09 NOTE — Progress Notes (Signed)
[name] [address] [place] [address] Phone: [phone] Subjective:    I'm seeing this patient by the request  of:  Shirlean Mylar, MD  CC: Right knee pain  [id number]  Kayla Jackson is a 50 y.o. female coming in with complaint of right knee pain.  Patient was seen previously and did have an MRI of the knee showing signs consistent with a with more of the patella tendon retraction syndrome as well as medial compartment osteoarthritic changes and a subcortical stress fracture noted.  Patient is here to have PRP     No past medical history on file. Past Surgical History:  Procedure Laterality Date  . BREAST CYST ASPIRATION Left    calcified milk duct  . CESAREAN SECTION     Social History   Socioeconomic History  . Marital status: Married    Spouse name: Not on file  . Number of children: Not on file  . Years of education: Not on file  . Highest education level: Not on file  Occupational History  . Not on file  Tobacco Use  . Smoking status: Never Smoker  . Smokeless tobacco: Never Used  Vaping Use  . Vaping Use: Never used  Substance and Sexual Activity  . Alcohol use: Yes    Comment: occ  . Drug use: No  . Sexual activity: Not on file  Other Topics Concern  . Not on file  Social History Narrative  . Not on file   Social Determinants of Health   Financial Resource Strain: Not on file  Food Insecurity: Not on file  Transportation Needs: Not on file  Physical Activity: Not on file  Stress: Not on file  Social Connections: Not on file   No Known Allergies No family history on file.   Current Outpatient Medications (Cardiovascular):  .  nitroGLYCERIN (NITRO-DUR) 0.1 mg/hr patch, 1/4 patch daily   Current Outpatient Medications (Analgesics):  .  ibuprofen (ADVIL) 600 MG tablet, Take 600 mg by mouth daily.   Current Outpatient Medications (Other):  .  gabapentin (NEURONTIN) 100 MG capsule, Take 2 capsules  (200 mg total) by mouth at bedtime. .  Vitamin D, Ergocalciferol, (DRISDOL) 1.25 MG (50000 UNIT) CAPS capsule, Take 1 capsule (50,000 Units total) by mouth every 7 (seven) days.   Reviewed prior external information including notes and imaging from  primary care provider As well as notes that were available from care everywhere and other healthcare systems.  Past medical history, social, surgical and family history all reviewed in electronic medical record.  No pertanent information unless stated regarding to the chief complaint.   Review of Systems:  No headache, visual changes, nausea, vomiting, diarrhea, constipation, dizziness, abdominal pain, skin rash, fevers, chills, night sweats, weight loss, swollen lymph nodes, body aches,chest pain, shortness of breath, mood changes. POSITIVE muscle aches, joint swelling  Objective  Height 5\' 5"  (1.651 m), weight 186 lb (84.4 kg).   General: No apparent distress alert and oriented x3 mood and affect normal, dressed appropriately.  HEENT: Pupils equal, extraocular movements intact  Respiratory: Patient's speak in full sentences and does not appear short of breath  Cardiovascular: No lower extremity edema, non tender, no erythema  Gait mild antalgic MSK:   Right knee exam is tender to palpation.  Trace effusion noted.  No significant instability.  Pain mostly over the medial joint line  After informed written and verbal consent, patient was seated on exam table. Right knee was prepped  with alcohol swab and utilizing anterolateral approach, patient's right knee space was injected with 4 cc of 0.5% Marcaine and then 6 cc of 3 centrifuge PRP patient tolerated the procedure well without immediate complications.    Impression and Recommendations:     The above documentation has been reviewed and is accurate and complete [name], DO

## 2020-08-09 NOTE — Assessment & Plan Note (Signed)
Patient's MRI of the right knee shows the patient does have moderate cyst osteoarthritic changes noted of the medial compartment.  Patient also has what appears to be more of a patellofemoral friction syndrome.  Patient is having increasing instability.  Discussed wearing the brace on a regular basis.  We will try PRP to help with some potential healing aspects of this.  Patient will be on seated work only for 2 weeks.  Continue the vitamin D.  Follow-up again in 4 to 6 weeks after the PRP.

## 2020-08-09 NOTE — Patient Instructions (Signed)
Seated work only until May 9th See you this afternoon for PRP See me for the back in 6 weeks

## 2020-08-09 NOTE — Assessment & Plan Note (Signed)
Patient does have moderate spinal stenosis noted of the lumbar spine.  Noted mostly of the L4-L5 level.  Patient does have what seems to be more of a stress fracture of the L4 pedicles.  Patient wants to hold on any injection, attempted osteopathic manipulation, patient has the gabapentin as well as the vitamin D.  Discussed continuing the core stability exercises but avoiding repetitive extension of the back.  Follow-up with me again 4 to 6 weeks for this.

## 2020-08-11 ENCOUNTER — Encounter: Payer: Self-pay | Admitting: Family Medicine

## 2020-09-05 ENCOUNTER — Encounter: Payer: Self-pay | Admitting: Family Medicine

## 2020-09-06 ENCOUNTER — Ambulatory Visit (INDEPENDENT_AMBULATORY_CARE_PROVIDER_SITE_OTHER): Payer: No Typology Code available for payment source | Admitting: Family Medicine

## 2020-09-06 ENCOUNTER — Encounter: Payer: Self-pay | Admitting: Family Medicine

## 2020-09-06 ENCOUNTER — Ambulatory Visit: Payer: Self-pay

## 2020-09-06 ENCOUNTER — Other Ambulatory Visit: Payer: Self-pay

## 2020-09-06 VITALS — BP 116/82 | HR 85 | Ht 65.0 in | Wt 188.0 lb

## 2020-09-06 DIAGNOSIS — M25561 Pain in right knee: Secondary | ICD-10-CM

## 2020-09-06 NOTE — Patient Instructions (Signed)
Tru-pull lite for R knee Try to push knee and let's see how it does Dr. Jerl Santos and Dr. Luiz Blare See me as scheduled

## 2020-09-06 NOTE — Assessment & Plan Note (Signed)
Patient had PRP done but does have some mild limitation still in range of motion and still has swelling of the patellofemoral joint.  Will hold patient out of work till the anticipated start date of June 15.  Patient will increase activity slowly.  We will see patient back again on June 10.  We discussed with her at this point if this continues to give her difficulty I do think arthroscopic procedure may be necessary.

## 2020-09-06 NOTE — Progress Notes (Signed)
[facility] [address] [place] [address] Phone: [phone] Subjective:   Kayla Jackson, am serving as a scribe for Dr. Antoine Primas. This visit occurred during the SARS-CoV-2 public health emergency.  Safety protocols were in place, including screening questions prior to the visit, additional usage of staff PPE, and extensive cleaning of exam room while observing appropriate contact time as indicated for disinfecting solutions.   I'm seeing this patient by the request  of:  Shirlean Mylar, MD  CC: Right knee pain follow-up  [id number]   08/09/2020 PRP done today.  Tolerated the procedure well, discussed icing regimen and home exercises, discussed which activities to do which wants to avoid patient given any post PRP injection Hande.  We will hold patient seated work for at least the next 2 if not 4 weeks.  Patient will follow up with me again in 4 to 6 weeks  Update 09/06/2020 Kayla Jackson is a 50 y.o. female coming in with complaint of R knee pain. Patient states that she is unable to use a reciprocal gait when climbing stairs and has pain with planting foot and turning. Pain has improved overall though. Patient did walk at [employer] and this caused knee to be sore. Riding [employer] on light reistance does not cause pain.    Patient did have an MRI of the knee that did show the patient had a stress reaction as well as patellofemoral friction syndrome.  No past medical history on file. Past Surgical History:  Procedure Laterality Date  . BREAST CYST ASPIRATION Left    calcified milk duct  . CESAREAN SECTION     Social History   Socioeconomic History  . Marital status: Married    Spouse name: Not on file  . Number of children: Not on file  . Years of education: Not on file  . Highest education level: Not on file  Occupational History  . Not on file  Tobacco Use  . Smoking status: Never Smoker  . Smokeless tobacco: Never Used   Vaping Use  . Vaping Use: Never used  Substance and Sexual Activity  . Alcohol use: Yes    Comment: occ  . Drug use: No  . Sexual activity: Not on file  Other Topics Concern  . Not on file  Social History Narrative  . Not on file   Social Determinants of Health   Financial Resource Strain: Not on file  Food Insecurity: Not on file  Transportation Needs: Not on file  Physical Activity: Not on file  Stress: Not on file  Social Connections: Not on file   No Known Allergies No family history on file.   Current Outpatient Medications (Cardiovascular):  .  nitroGLYCERIN (NITRO-DUR) 0.1 mg/hr patch, 1/4 patch daily   Current Outpatient Medications (Analgesics):  .  ibuprofen (ADVIL) 600 MG tablet, Take 600 mg by mouth daily.   Current Outpatient Medications (Other):  .  gabapentin (NEURONTIN) 100 MG capsule, Take 2 capsules (200 mg total) by mouth at bedtime. .  Vitamin D, Ergocalciferol, (DRISDOL) 1.25 MG (50000 UNIT) CAPS capsule, Take 1 capsule (50,000 Units total) by mouth every 7 (seven) days.   Reviewed prior external information including notes and imaging from  primary care provider As well as notes that were available from care everywhere and other healthcare systems.  Past medical history, social, surgical and family history all reviewed in electronic medical record.  No pertanent information unless stated regarding to the chief complaint.  Review of Systems:  No headache, visual changes, nausea, vomiting, diarrhea, constipation, dizziness, abdominal pain, skin rash, fevers, chills, night sweats, weight loss, swollen lymph nodes, body aches, joint swelling, chest pain, shortness of breath, mood changes. POSITIVE muscle aches  Objective  Blood pressure 116/82, pulse 85, height 5\' 5"  (1.651 m), weight 188 lb (85.3 kg), SpO2 98 %.   General: No apparent distress alert and oriented x3 mood and affect normal, dressed appropriately.  HEENT: Pupils equal, extraocular  movements intact  Respiratory: Patient's speak in full sentences and does not appear short of breath  Cardiovascular: No lower extremity edema, non tender, no erythema  Gait normal with good balance and coordination.  MSK: Right knee exam does not have any significant instability.  Mild pain over the medial joint space.  Patient does still have lateral tracking of the patella noted.  Trace effusion noted of the patella joint  Limited musculoskeletal ultrasound was performed and interpreted by  Limited ultrasound of patient's right knee shows the patient does still have a trace effusion noted of the patellofemoral joint space.  Otherwise patient's medial and lateral joint space has no significant swelling. Impression: Interval improvement but still patellofemoral effusion noted    Impression and Recommendations:     The above documentation has been reviewed and is accurate and complete [name], DO

## 2020-09-20 ENCOUNTER — Ambulatory Visit: Payer: No Typology Code available for payment source | Admitting: Family Medicine

## 2020-09-21 NOTE — Progress Notes (Signed)
[name] [address] [place] [address] Phone: [phone] Subjective:   I Kayla Jackson am serving as a Neurosurgeon for Dr. Antoine Primas.  This visit occurred during the SARS-CoV-2 public health emergency.  Safety protocols were in place, including screening questions prior to the visit, additional usage of staff PPE, and extensive cleaning of exam room while observing appropriate contact time as indicated for disinfecting solutions.   I'm seeing this patient by the request  of:  Shirlean Mylar, MD  CC: Knee pain, back pain  [id number]  09/06/2020 Patient had PRP done but does have some mild limitation still in range of motion and still has swelling of the patellofemoral joint.  Will hold patient out of work till the anticipated start date of June 15.  Patient will increase activity slowly.  We will see patient back again on June 10.  We discussed with her at this point if this continues to give her difficulty I do think arthroscopic procedure may be necessary.  Update 09/22/2020 Kayla Jackson is a 50 y.o. female coming in with complaint of R knee pain. Patient states this is her new normal and it has been something to get used to. Knee brace makes it worse. Does not use the knee brace as much due to pain and swelling.  Patient states that still better/the PIP.  States that she has done things such as going to the mall and does well with activity but states that it has swelling at the end of the day.  Back pain as well    MRI of the knee IMPRESSION: 1. Patellar tendon-lateral femoral condyle friction syndrome. 2. Osteoarthritis of the knee most striking in the medial compartment and laterally in the patellofemoral joint. 3. Small knee effusion with thin medial plica. Very small Baker's cyst.  No past medical history on file. Past Surgical History:  Procedure Laterality Date   BREAST CYST ASPIRATION Left    calcified milk duct   CESAREAN  SECTION     Social History   Socioeconomic History   Marital status: Married    Spouse name: Not on file   Number of children: Not on file   Years of education: Not on file   Highest education level: Not on file  Occupational History   Not on file  Tobacco Use   Smoking status: Never   Smokeless tobacco: Never  Vaping Use   Vaping Use: Never used  Substance and Sexual Activity   Alcohol use: Yes    Comment: occ   Drug use: No   Sexual activity: Not on file  Other Topics Concern   Not on file  Social History Narrative   Not on file   Social Determinants of Health   Financial Resource Strain: Not on file  Food Insecurity: Not on file  Transportation Needs: Not on file  Physical Activity: Not on file  Stress: Not on file  Social Connections: Not on file   No Known Allergies No family history on file.   Current Outpatient Medications (Cardiovascular):    nitroGLYCERIN (NITRO-DUR) 0.1 mg/hr patch, 1/4 patch daily   Current Outpatient Medications (Analgesics):    ibuprofen (ADVIL) 600 MG tablet, Take 600 mg by mouth daily.   Current Outpatient Medications (Other):    gabapentin (NEURONTIN) 100 MG capsule, Take 2 capsules (200 mg total) by mouth at bedtime.   Vitamin D, Ergocalciferol, (DRISDOL) 1.25 MG (50000 UNIT) CAPS capsule, Take 1 capsule (50,000 Units total) by  mouth every 7 (seven) days.   Reviewed prior external information including notes and imaging from  primary care provider As well as notes that were available from care everywhere and other healthcare systems.  Past medical history, social, surgical and family history all reviewed in electronic medical record.  No pertanent information unless stated regarding to the chief complaint.   Review of Systems:  No headache, visual changes, nausea, vomiting, diarrhea, constipation, dizziness, abdominal pain, skin rash, fevers, chills, night sweats, weight loss, swollen lymph nodes, body aches, joint  swelling, chest pain, shortness of breath, mood changes. POSITIVE muscle aches  Objective  Blood pressure 114/72, pulse 95, height 5\' 5"  (1.651 m), weight 186 lb (84.4 kg), SpO2 98 %.   General: No apparent distress alert and oriented x3 mood and affect normal, dressed appropriately.  HEENT: Pupils equal, extraocular movements intact  Respiratory: Patient's speak in full sentences and does not appear short of breath  Cardiovascular: No lower extremity edema, non tender, no erythema  Gait normal with good balance and coordination.  MSK: Patient's low back does have some loss of lordosis.  Patient does have some paraspinal musculature tightness noted. Patient does have mild tightness noted with FABER test bilaterally.  Patient's right knee does not have any significant swelling.  Mild crepitus noted.  No significant instability noted.  Osteopathic findings C6 flexed rotated and side bent right T8 extended rotated and side bent right inhaled rib L1 flexed rotated and side bent right L5 flexed rotated and side bent left Sacrum right on right    Impression and Recommendations:     The above documentation has been reviewed and is accurate and complete , DO

## 2020-09-22 ENCOUNTER — Ambulatory Visit (INDEPENDENT_AMBULATORY_CARE_PROVIDER_SITE_OTHER): Payer: No Typology Code available for payment source | Admitting: Family Medicine

## 2020-09-22 ENCOUNTER — Other Ambulatory Visit: Payer: Self-pay

## 2020-09-22 ENCOUNTER — Encounter: Payer: Self-pay | Admitting: Family Medicine

## 2020-09-22 VITALS — BP 114/72 | HR 95 | Ht 65.0 in | Wt 186.0 lb

## 2020-09-22 DIAGNOSIS — M9902 Segmental and somatic dysfunction of thoracic region: Secondary | ICD-10-CM

## 2020-09-22 DIAGNOSIS — M9901 Segmental and somatic dysfunction of cervical region: Secondary | ICD-10-CM | POA: Diagnosis not present

## 2020-09-22 DIAGNOSIS — M9904 Segmental and somatic dysfunction of sacral region: Secondary | ICD-10-CM | POA: Diagnosis not present

## 2020-09-22 DIAGNOSIS — M9908 Segmental and somatic dysfunction of rib cage: Secondary | ICD-10-CM | POA: Diagnosis not present

## 2020-09-22 DIAGNOSIS — M9903 Segmental and somatic dysfunction of lumbar region: Secondary | ICD-10-CM | POA: Diagnosis not present

## 2020-09-22 DIAGNOSIS — M533 Sacrococcygeal disorders, not elsewhere classified: Secondary | ICD-10-CM | POA: Diagnosis not present

## 2020-09-22 DIAGNOSIS — M999 Biomechanical lesion, unspecified: Secondary | ICD-10-CM | POA: Diagnosis not present

## 2020-09-22 DIAGNOSIS — M25561 Pain in right knee: Secondary | ICD-10-CM

## 2020-09-22 NOTE — Assessment & Plan Note (Signed)
Patient still has signs and symptoms of more consistent with the patellofemoral than it is the medial joint space narrowing.  I think that this continues to give her some difficulty.  We discussed wearing the brace on certain activities.  Discussed the potential of repeating the steroid injection.  Patient wants to avoid any type of surgical intervention still.  We will check again in 6 weeks

## 2020-09-22 NOTE — Patient Instructions (Addendum)
Good to see you Wear the brace with working out and log walks Ice at the end of the day Ok to start rowing See me again in 6-8 weeks

## 2020-09-22 NOTE — Assessment & Plan Note (Signed)

## 2020-09-22 NOTE — Assessment & Plan Note (Signed)
No radicular symptoms.  Discussed which activities to doing which wants to avoid.  Increase activity as well as icing regimen.  Follow-up again in 4 to 8 weeks patient is have gabapentin if she would like it.

## 2020-09-28 ENCOUNTER — Encounter: Payer: Self-pay | Admitting: Family Medicine

## 2020-10-20 ENCOUNTER — Encounter: Payer: Self-pay | Admitting: Family Medicine

## 2020-11-08 NOTE — Progress Notes (Signed)
[name] [address] [place] [address] Phone: [phone] Subjective:    I'm seeing this patient by the request  of:  Shirlean Mylar, MD  CC: Knee pain follow-up, back pain follow-up  [id number]  Kayla Jackson is a 50 y.o. female coming in with complaint of back and neck pain. OMT 09/22/2020. Also f/u for R knee pain. Patient states that her knee pain increases as the day goes on. Patient is frustrated and would like to consider surgery. Knee is catching over medial aspect. Back pain has increased due to antalgic gait. Patient is having numbness in lateral aspect of foot coming from her back.   Medications patient has been prescribed: Vit D  Taking:     MRI of the knee show the patient did have what appears to be more of a patella tendon lateral femoral condyle friction syndrome as well as osteoarthritic changes moderate in nature mostly of the medial compartment.  Meniscus appear to be unremarkable.    Reviewed prior external information including notes and imaging from previsou exam, outside providers and external EMR if available.   As well as notes that were available from care everywhere and other healthcare systems.  Past medical history, social, surgical and family history all reviewed in electronic medical record.  No pertanent information unless stated regarding to the chief complaint.   No past medical history on file.  No Known Allergies   Review of Systems:  No headache, visual changes, nausea, vomiting, diarrhea, constipation, dizziness, abdominal pain, skin rash, fevers, chills, night sweats, weight loss, swollen lymph nodes, body aches, joint swelling, chest pain, shortness of breath, mood changes. POSITIVE muscle aches  Objective  Blood pressure 110/80, pulse 87, height 5\' 5"  (1.651 m), weight 185 lb (83.9 kg), SpO2 97 %.   General: No apparent distress alert and oriented x3 mood and affect normal, dressed  appropriately.  HEENT: Pupils equal, extraocular movements intact  Respiratory: Patient's speak in full sentences and does not appear short of breath  Cardiovascular: No lower extremity edema, non tender, no erythema  Right knee exam does have a small effusion noted.  Patient is tender to palpation diffusely but mostly over the medial joint line and the patellofemoral joint.  Patient still has positive McMurray's noted.  Mild instability with valgus stress.  Osteopathic findings  C2 flexed rotated and side bent right C6 flexed rotated and side bent left T3 extended rotated and side bent right inhaled rib T11 extended rotated and side bent left L2 flexed rotated and side bent right Sacrum right on right       Assessment and Plan: Right knee pain Patient has failed all conservative therapy for the right knee.  Does have moderate arthritic changes.  He does have what appears to be more patellofemoral friction syndrome.  Failed conservative therapy including physical therapy, steroid injections, viscosupplementation as well as PRP.  At this point patient would like to look into surgical intervention with this affecting daily activities, her job, as well as time with her family.  Waking her up at night as well.  Feels like she needs medications at all time to even be able to do anything.  Patient will be referred today and we will discuss again at follow-up.  SI (sacroiliac) joint dysfunction Chronic problem that exacerbated secondary to the amount of antalgic gait patient has.  We discussed with patient about monitoring.  Discussed core strengthening.  Continuing to be active and responding well to manipulation.  Nonallopathic problems  Decision today to treat with OMT was based on Physical Exam  After verbal consent patient was treated with HVLA, ME, FPR techniques in cervical, rib, thoracic, lumbar, and sacral  areas  Patient tolerated the procedure well with improvement in  symptoms  Patient given exercises, stretches and lifestyle modifications  See medications in patient instructions if given  Patient will follow up in 4-8 weeks     The above documentation has been reviewed and is accurate and complete [name], DO        Note: This dictation was prepared with Dragon dictation along with smaller phrase technology. Any transcriptional errors that result from this process are unintentional.

## 2020-11-09 ENCOUNTER — Encounter: Payer: Self-pay | Admitting: Family Medicine

## 2020-11-09 ENCOUNTER — Ambulatory Visit (INDEPENDENT_AMBULATORY_CARE_PROVIDER_SITE_OTHER): Payer: No Typology Code available for payment source | Admitting: Family Medicine

## 2020-11-09 ENCOUNTER — Ambulatory Visit: Payer: Self-pay

## 2020-11-09 ENCOUNTER — Other Ambulatory Visit: Payer: Self-pay

## 2020-11-09 VITALS — BP 110/80 | HR 87 | Ht 65.0 in | Wt 185.0 lb

## 2020-11-09 DIAGNOSIS — M533 Sacrococcygeal disorders, not elsewhere classified: Secondary | ICD-10-CM | POA: Diagnosis not present

## 2020-11-09 DIAGNOSIS — M25561 Pain in right knee: Secondary | ICD-10-CM

## 2020-11-09 DIAGNOSIS — M9903 Segmental and somatic dysfunction of lumbar region: Secondary | ICD-10-CM | POA: Diagnosis not present

## 2020-11-09 DIAGNOSIS — M9902 Segmental and somatic dysfunction of thoracic region: Secondary | ICD-10-CM

## 2020-11-09 DIAGNOSIS — M9901 Segmental and somatic dysfunction of cervical region: Secondary | ICD-10-CM

## 2020-11-09 DIAGNOSIS — M9904 Segmental and somatic dysfunction of sacral region: Secondary | ICD-10-CM

## 2020-11-09 DIAGNOSIS — M9908 Segmental and somatic dysfunction of rib cage: Secondary | ICD-10-CM

## 2020-11-09 NOTE — Assessment & Plan Note (Signed)
Patient has failed all conservative therapy for the right knee.  Does have moderate arthritic changes.  He does have what appears to be more patellofemoral friction syndrome.  Failed conservative therapy including physical therapy, steroid injections, viscosupplementation as well as PRP.  At this point patient would like to look into surgical intervention with this affecting daily activities, her job, as well as time with her family.  Waking her up at night as well.  Feels like she needs medications at all time to even be able to do anything.  Patient will be referred today and we will discuss again at follow-up.

## 2020-11-09 NOTE — Patient Instructions (Signed)
Keep doing stretches Dr. Jerl Santos will call you  See me in 4-5 weeks

## 2020-11-09 NOTE — Assessment & Plan Note (Signed)
Chronic problem that exacerbated secondary to the amount of antalgic gait patient has.  We discussed with patient about monitoring.  Discussed core strengthening.  Continuing to be active and responding well to manipulation.

## 2020-11-16 ENCOUNTER — Encounter: Payer: Self-pay | Admitting: Family Medicine

## 2020-12-08 ENCOUNTER — Encounter: Payer: Self-pay | Admitting: Family Medicine

## 2020-12-13 ENCOUNTER — Ambulatory Visit: Payer: No Typology Code available for payment source | Admitting: Family Medicine

## 2022-01-05 IMAGING — DX DG LUMBAR SPINE 2-3V
3 series · 3 of 3 positions shown · non-contrast
Comparison: April 06, 2014

CLINICAL DATA: Low back pain for 5 years

EXAM:
LUMBAR SPINE - 2-3 VIEW

[l-spine ap]
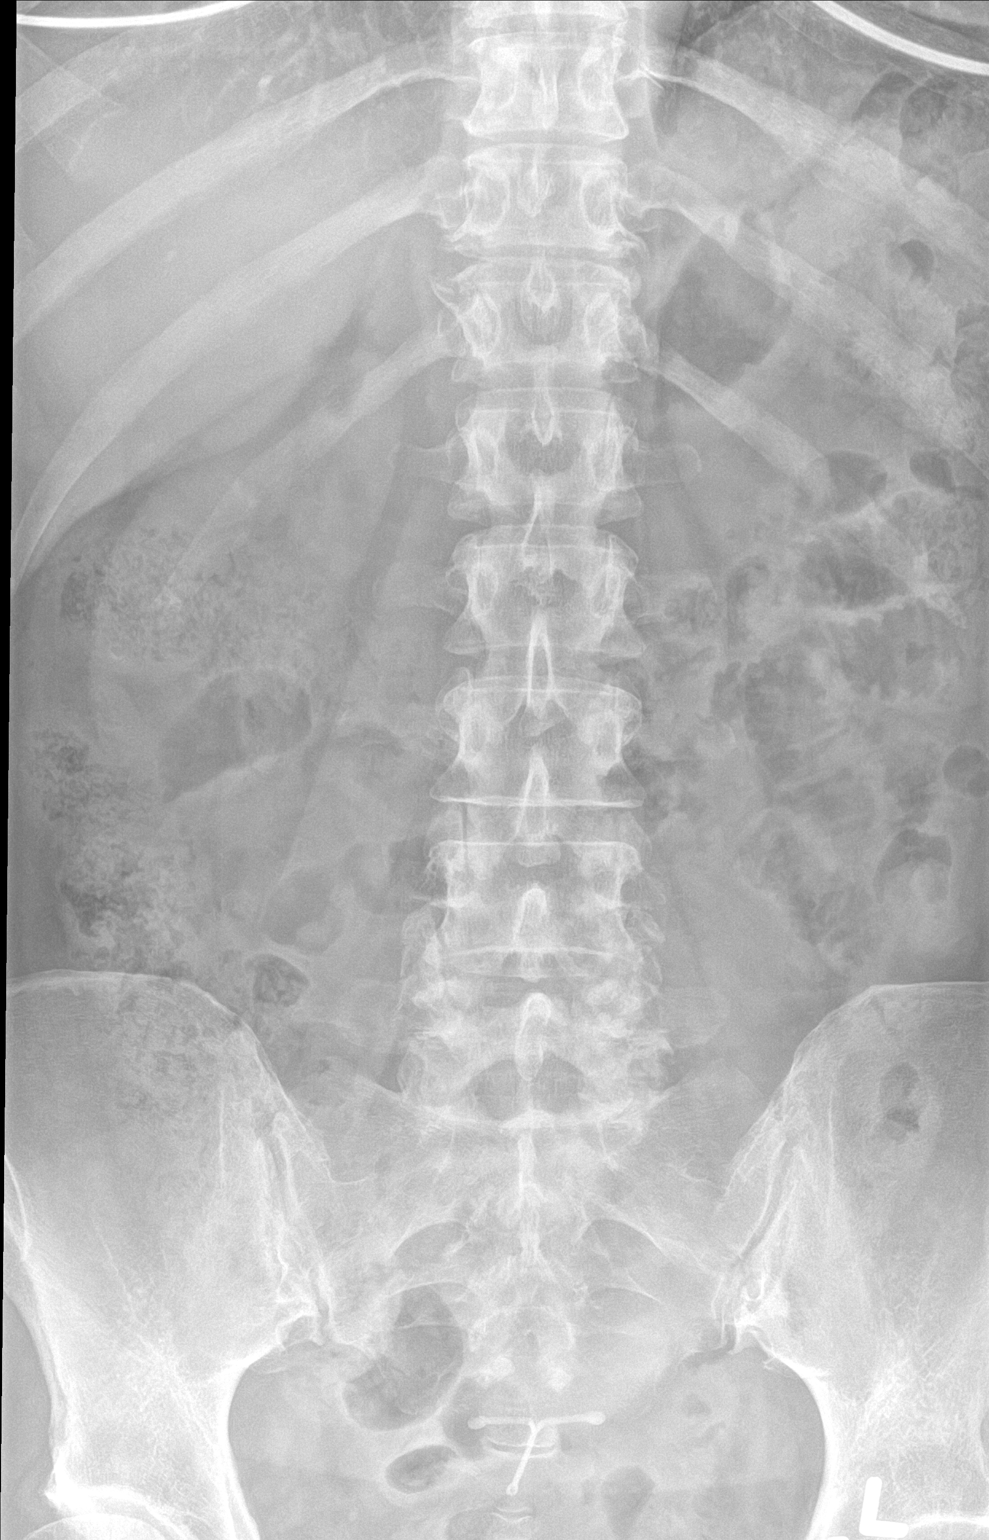

[l-spine lateral (1 of 2)]
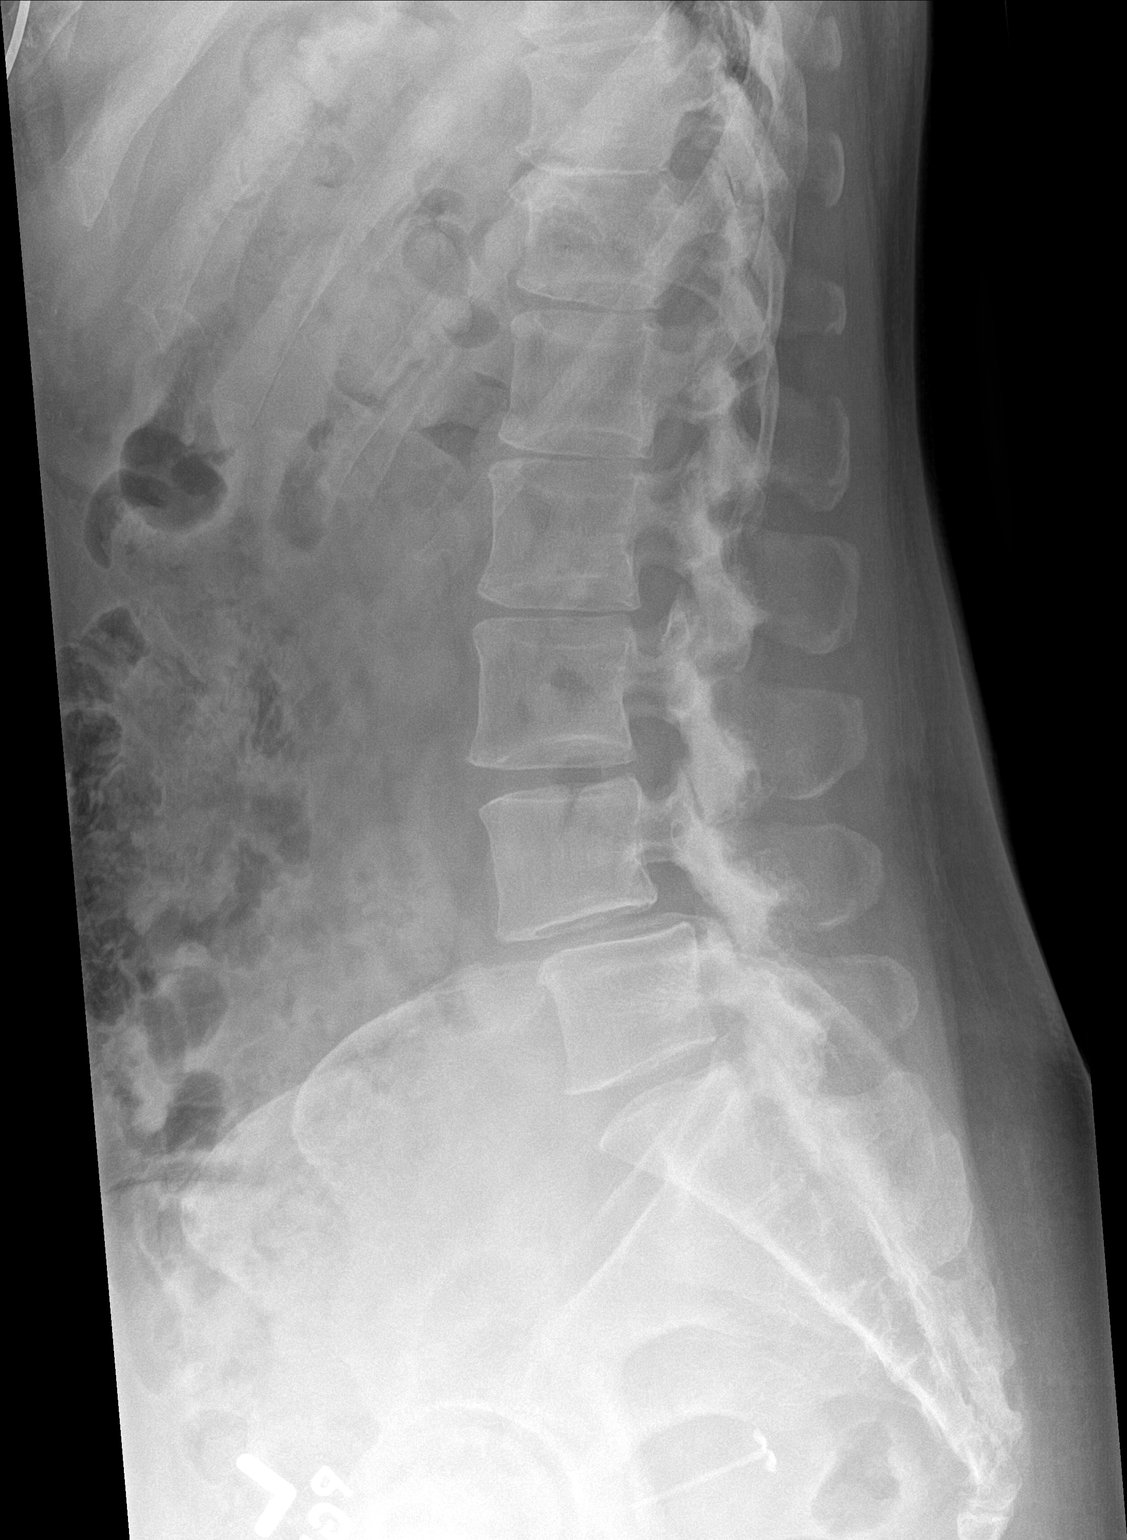

[l-spine lateral (2 of 2)]
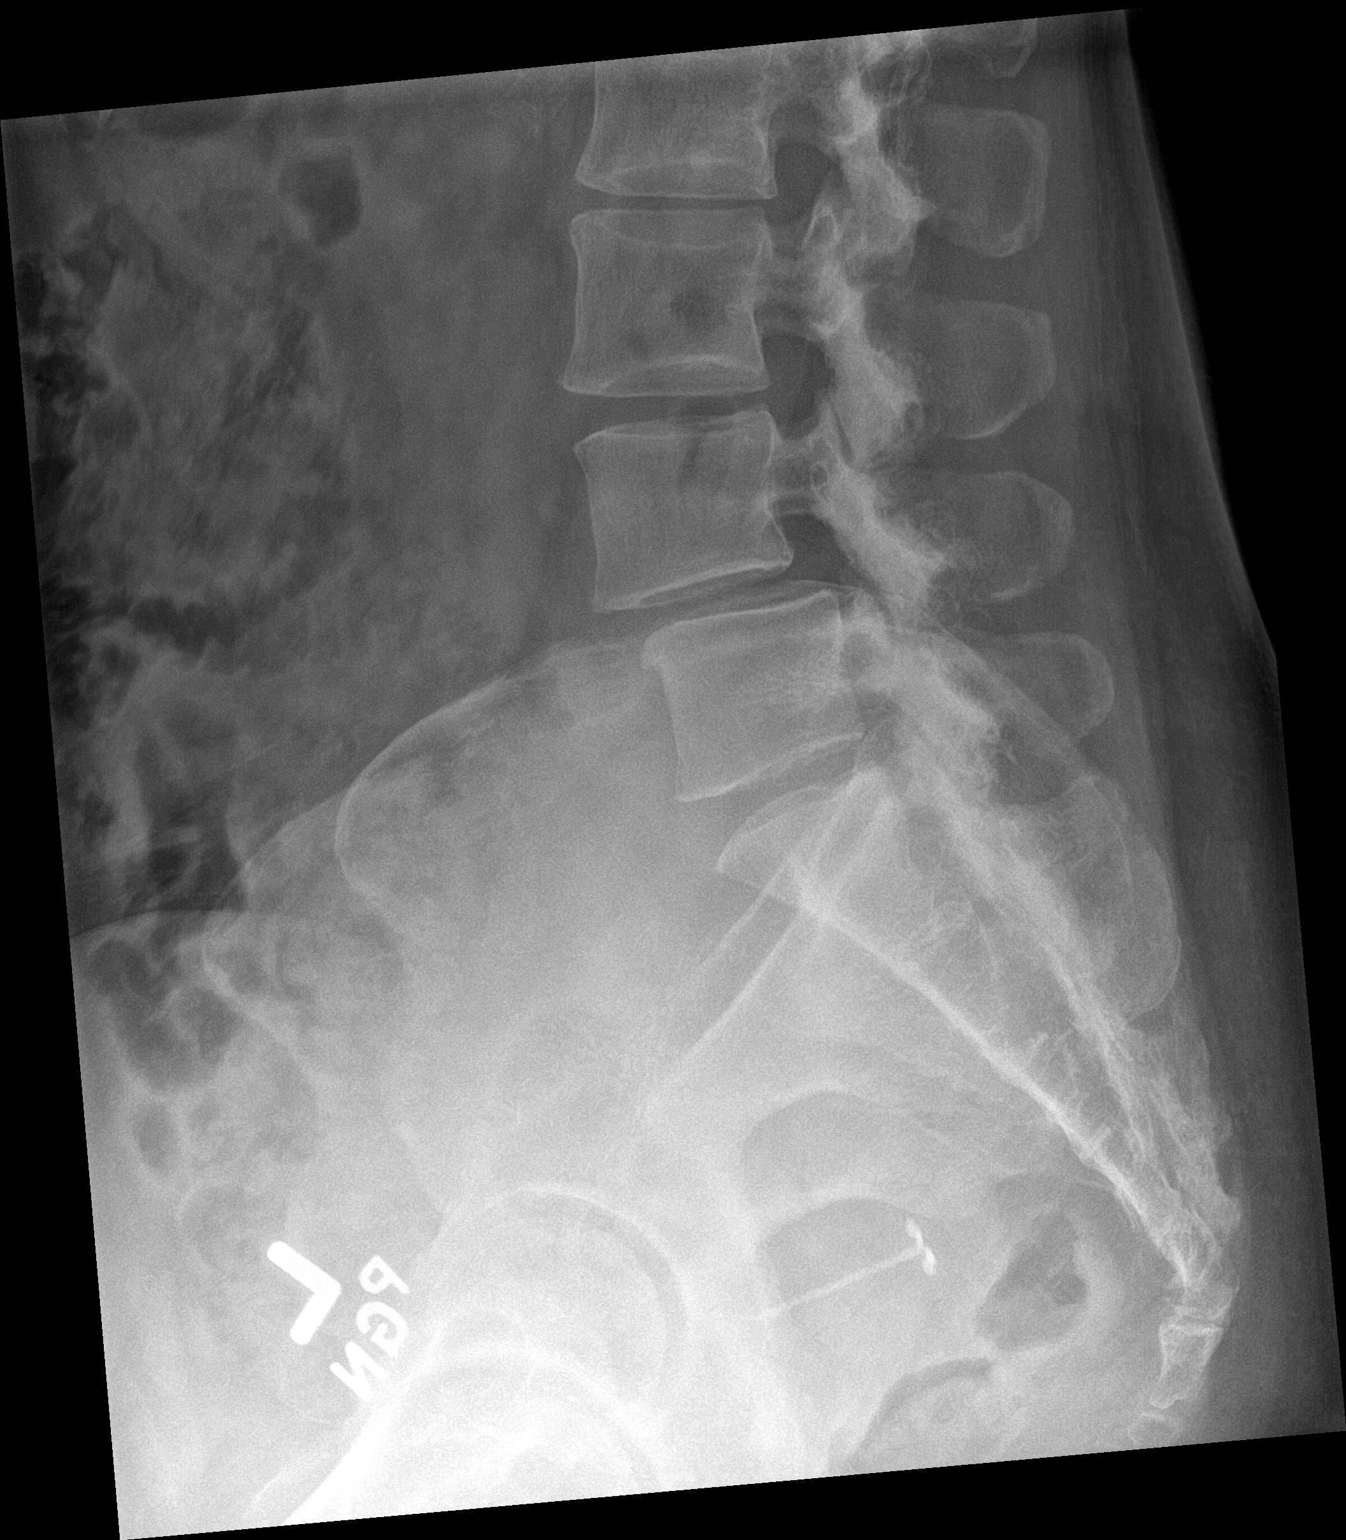

[3 of 3 positions shown; findings below may reference images not displayed]

FINDINGS: There are five non-rib bearing lumbar-type vertebral bodies. There
is grade 1 anterolisthesis of L4-5. This is likely due to facet
arthropathy which is most severe in the lower lumbar spine. There is
no evidence for acute fracture or subluxation. Mild intervertebral
disc space height loss of L4-5 with endplate proliferative changes.
Intervertebral disc space height loss and endplate proliferative
changes of the lower thoracic spine. IUD. Degenerative changes of
the sacroiliac joints.
IMPRESSION: 1. There is grade 1 anterolisthesis of L4-5, likely secondary to
moderate to severe facet arthropathy.

## 2022-01-05 IMAGING — DX DG ANKLE COMPLETE 3+V*L*
3 series · 3 of 3 positions shown · non-contrast
Comparison: None.

CLINICAL DATA: Inflammation of LEFT ankle for 6 months

EXAM:
LEFT ANKLE COMPLETE - 3+ VIEW

[ankle ap]
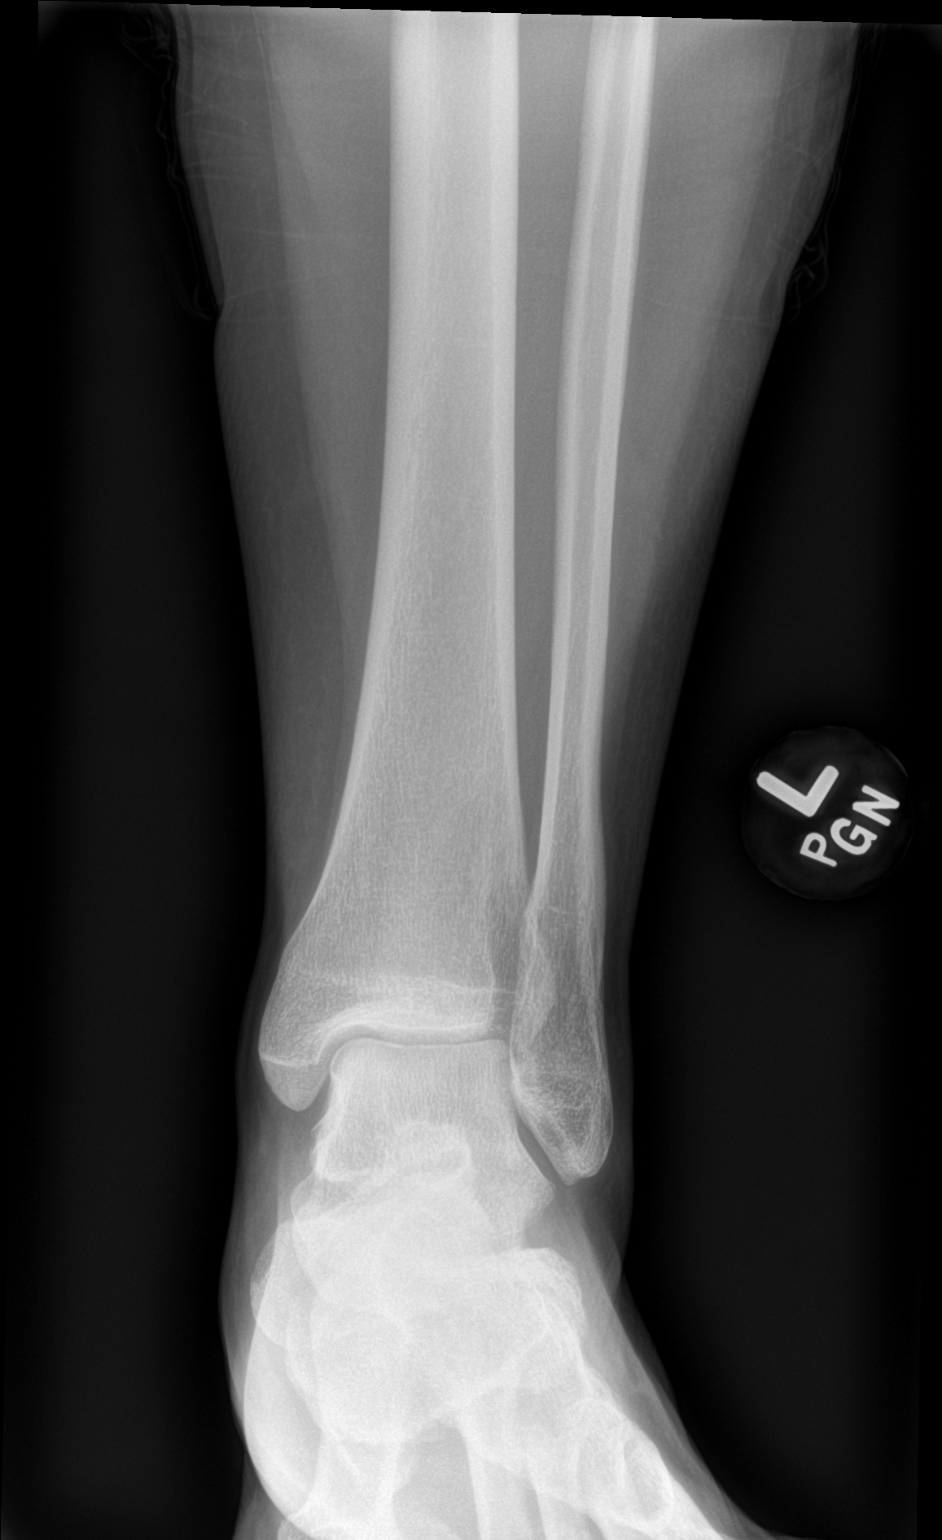

[ankle obl]
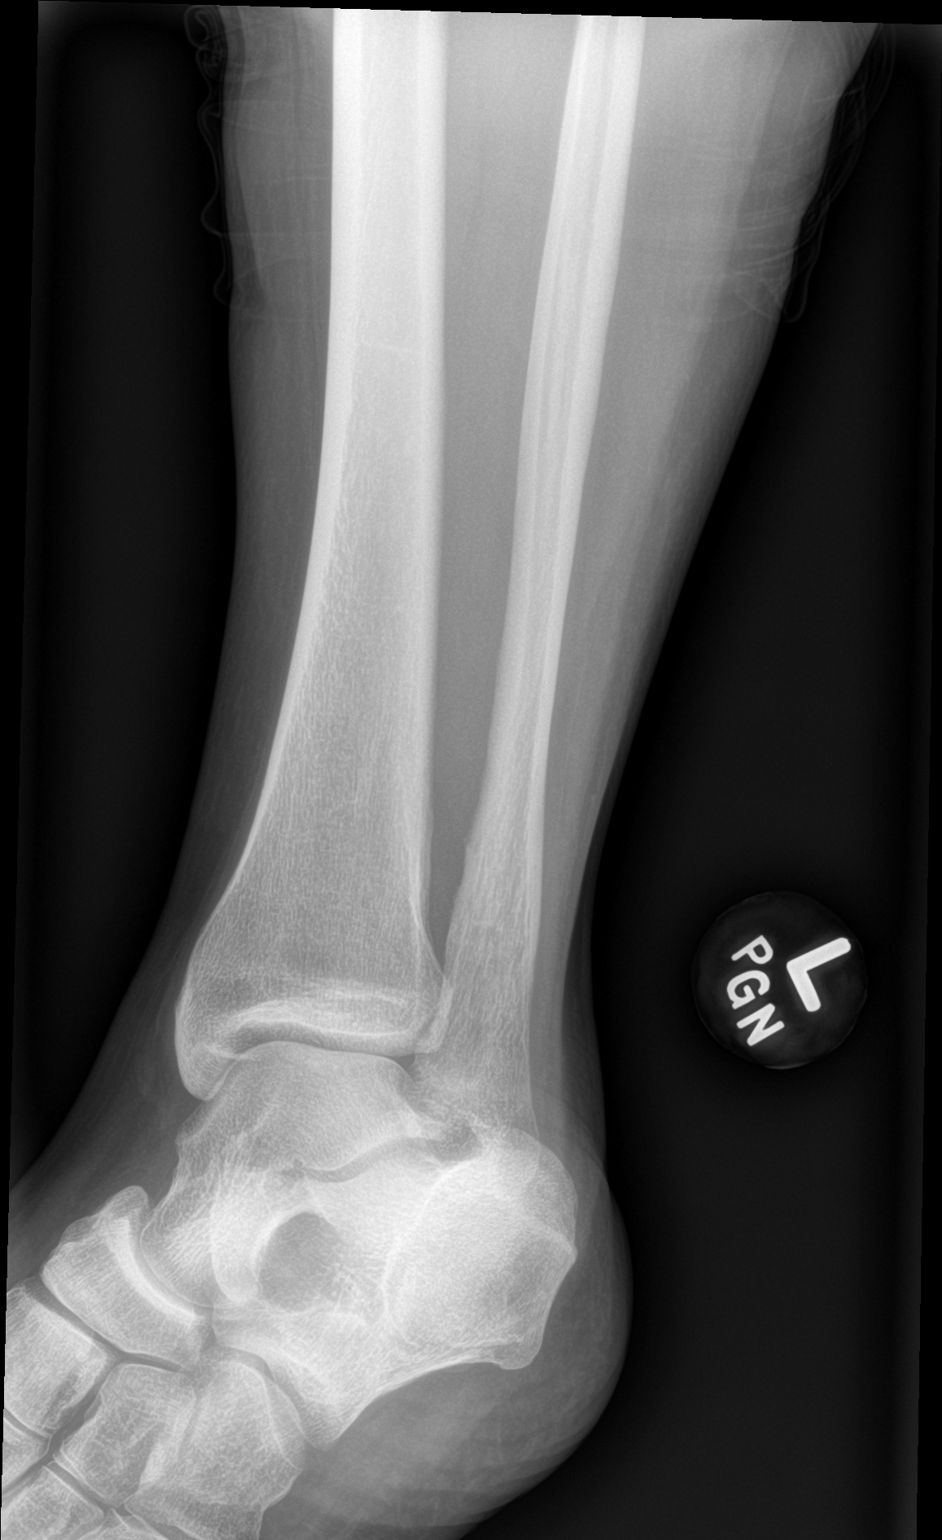

[ankle lat]
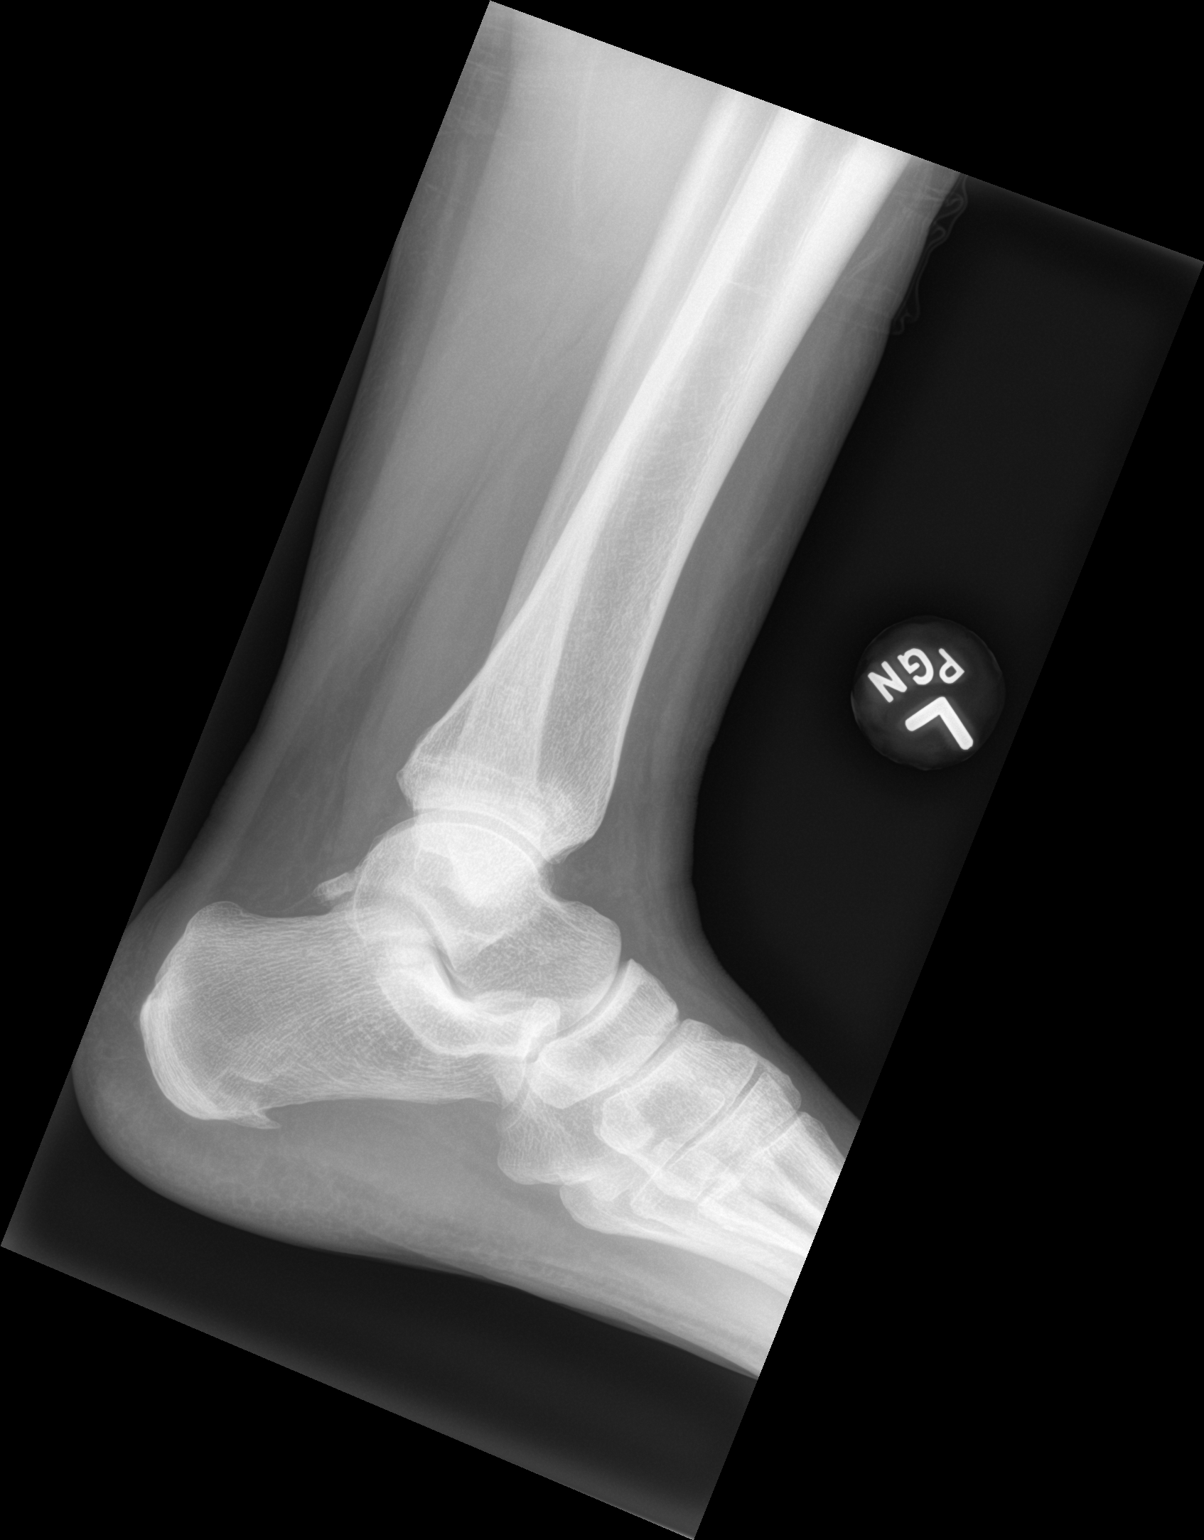

[3 of 3 positions shown; findings below may reference images not displayed]

FINDINGS: No acute fracture or dislocation. Ankle mortise is preserved.
Enthesophyte of the plantar calcaneus. No area of erosion or osseous
destruction. No unexpected radiopaque foreign body. Soft tissues are
unremarkable.
IMPRESSION: 1.  No acute fracture or dislocation.

## 2022-04-16 NOTE — Progress Notes (Signed)
[name] [place] Phone: [phone] Subjective:   Rito Ehrlich, am serving as a scribe for Dr. Hulan Saas.  I'm seeing this patient by the request  of:  Maurice Small, MD  CC: Left shoulder pain  [id number]    Kayla Jackson is a 52 y.o. female coming in with complaint of L shoulder pain. Last seen July 2022. Patient states 2 months ago the shoulder started to get pain and since then has been getting worse as times go on. No Moi. Started with stiffness and soreness and now she can barely move her arm without pain, ROM is lacking, patient taking Ibuprofen daily. Locates pain to the top of shoulder even to the touch. Patient has to be very careful lifting arm or reaching behind her her back. 6 months ago patient started rowing more and states that her shoulder actually doesn't hurt with rowing, states that she stopped rowing for two weeks to see if it would help with the pain and the pain is actually worse.        No past medical history on file. Past Surgical History:  Procedure Laterality Date   BREAST CYST ASPIRATION Left    calcified milk duct   CESAREAN SECTION     Social History   Socioeconomic History   Marital status: Married    Spouse name: Not on file   Number of children: Not on file   Years of education: Not on file   Highest education level: Not on file  Occupational History   Not on file  Tobacco Use   Smoking status: Never   Smokeless tobacco: Never  Vaping Use   Vaping Use: Never used  Substance and Sexual Activity   Alcohol use: Yes    Comment: occ   Drug use: No   Sexual activity: Not on file  Other Topics Concern   Not on file  Social History Narrative   Not on file   Social Determinants of Health   Financial Resource Strain: Not on file  Food Insecurity: Not on file  Transportation Needs: Not on file  Physical Activity: Not on file  Stress: Not on file  Social  Connections: Not on file   No Known Allergies No family history on file.  Current Outpatient Medications (Endocrine & Metabolic):    levonorgestrel (MIRENA) 20 MCG/DAY IUD, 1 each by Intrauterine route once.  Current Outpatient Medications (Cardiovascular):    nitroGLYCERIN (NITRO-DUR) 0.1 mg/hr patch, 1/4 patch daily (Patient not taking: Reported on 04/19/2022)   Current Outpatient Medications (Analgesics):    ibuprofen (ADVIL) 600 MG tablet, Take 600 mg by mouth daily.   Current Outpatient Medications (Other):    Vitamin D, Ergocalciferol, (DRISDOL) 1.25 MG (50000 UNIT) CAPS capsule, Take 1 capsule (50,000 Units total) by mouth every 7 (seven) days.   gabapentin (NEURONTIN) 100 MG capsule, Take 2 capsules (200 mg total) by mouth at bedtime. (Patient not taking: Reported on 04/19/2022)   Reviewed prior external information including notes and imaging from  primary care provider As well as notes that were available from care everywhere and other healthcare systems.  Past medical history, social, surgical and family history all reviewed in electronic medical record.  No pertanent information unless stated regarding to the chief complaint.   Review of Systems:  No headache, visual changes, nausea, vomiting, diarrhea, constipation, dizziness, abdominal pain, skin rash, fevers, chills, night sweats, weight loss, swollen lymph nodes, body aches, joint swelling,  chest pain, shortness of breath, mood changes. POSITIVE muscle aches  Objective  Blood pressure 120/60, pulse 73, height 5\' 5"  (1.651 m), weight 180 lb (81.6 kg), SpO2 99 %.   General: No apparent distress alert and oriented x3 mood and affect normal, dressed appropriately.  HEENT: Pupils equal, extraocular movements intact  Respiratory: Patient's speak in full sentences and does not appear short of breath  Cardiovascular: No lower extremity edema, non tender, no erythema  Left shoulder does have positive crossover noted.  Rotator  cuff strength is intact.  Mild positive impingement noted.  Limited muscular skeletal ultrasound was performed and interpreted by Hulan Saas, M  Limited ultrasound of patient's shoulder shows the patient does have significant hypoechoic changes of the acromioclavicular joint with significant narrowing noted as well.  Patient does have some hypoechoic changes in the subacromial area that is consistent with mild bursitis and does have some hyperechoic changes noted of the supraspinatus consistent with more of a calcific tendinitis. Impression: Calcific tendinitis, mild bursitis as well as arthritis of the acromioclavicular joint  Procedure: Real-time Ultrasound Guided Injection of left glenohumeral joint Device: GE Logiq E  Ultrasound guided injection is preferred based studies that show increased duration, increased effect, greater accuracy, decreased procedural pain, increased response rate with ultrasound guided versus blind injection.  Verbal informed consent obtained.  Time-out conducted.  Noted no overlying erythema, induration, or other signs of local infection.  Skin prepped in a sterile fashion.  Local anesthesia: Topical Ethyl chloride.  With sterile technique and under real time ultrasound guidance:  Joint visualized.  21g 2 inch needle inserted posterior approach. Pictures taken for needle placement. Patient did have injection of 2 cc of 0.5% Marcaine, and 1cc of Kenalog 40 mg/dL. Completed without difficulty  Pain immediately improved suggesting accurate placement of the medication.  Advised to call if fevers/chills, erythema, induration, drainage, or persistent bleeding.  Impression: Technically successful ultrasound guided injection.  Procedure: Real-time Ultrasound Guided Injection of left acromioclavicular joint Device: GE Logiq Q7 Ultrasound guided injection is preferred based studies that show increased duration, increased effect, greater accuracy, decreased procedural  pain, increased response rate, and decreased cost with ultrasound guided versus blind injection.  Verbal informed consent obtained.  Time-out conducted.  Noted no overlying erythema, induration, or other signs of local infection.  Skin prepped in a sterile fashion.  Local anesthesia: Topical Ethyl chloride.  With sterile technique and under real time ultrasound guidance: With a 25-gauge half inch needle injected with 0.5 cc of 0.5% Marcaine and 0.5 cc of Kenalog 40 mg/mL Completed without difficulty  Pain immediately improved suggesting accurate placement of the medication.  Advised to call if fevers/chills, erythema, induration, drainage, or persistent bleeding.  Impression: Technically successful ultrasound guided injection.  [id number]; 15 additional minutes spent for Therapeutic exercises as stated in above notes.  This included exercises focusing on stretching, strengthening, with significant focus on eccentric aspects.   Long term goals include an improvement in range of motion, strength, endurance as well as avoiding reinjury. Patient's frequency would include in 1-2 times a day, 3-5 times a week for a duration of 6-12 weeks. Shoulder Exercises that included:  Basic scapular stabilization to include adduction and depression of scapula Scaption, focusing on proper movement and good control Internal and External rotation utilizing a theraband, with elbow tucked at side entire time Rows with theraband   Proper technique shown and discussed handout in great detail with ATC.  All questions were discussed and answered.  Impression and Recommendations:     The above documentation has been reviewed and is accurate and complete Lyndal Pulley, DO

## 2022-04-19 ENCOUNTER — Ambulatory Visit: Payer: No Typology Code available for payment source | Admitting: Family Medicine

## 2022-04-19 ENCOUNTER — Ambulatory Visit: Payer: Self-pay

## 2022-04-19 ENCOUNTER — Ambulatory Visit (INDEPENDENT_AMBULATORY_CARE_PROVIDER_SITE_OTHER): Payer: No Typology Code available for payment source

## 2022-04-19 VITALS — BP 120/60 | HR 73 | Ht 65.0 in | Wt 180.0 lb

## 2022-04-19 DIAGNOSIS — M7552 Bursitis of left shoulder: Secondary | ICD-10-CM | POA: Diagnosis not present

## 2022-04-19 DIAGNOSIS — M25512 Pain in left shoulder: Secondary | ICD-10-CM

## 2022-04-19 DIAGNOSIS — M19019 Primary osteoarthritis, unspecified shoulder: Secondary | ICD-10-CM | POA: Insufficient documentation

## 2022-04-19 DIAGNOSIS — M19012 Primary osteoarthritis, left shoulder: Secondary | ICD-10-CM | POA: Diagnosis not present

## 2022-04-19 NOTE — Assessment & Plan Note (Signed)
Side and seems to be consistent with more of patient's symptoms.  Discussed with patient about icing regimen and home exercises, which activities to do and which ones to avoid.  Discussed icing regimen and home exercises.  Patient to work with Product/process development scientist.  Follow-up again in 6 to 8 weeks.

## 2022-04-19 NOTE — Assessment & Plan Note (Signed)
Patient given injection and tolerated the procedure well, discussed icing regimen and home exercises, which activities to do and which ones to avoid.  Follow-up again in 6 to 8 weeks

## 2022-04-19 NOTE — Patient Instructions (Addendum)
Good to see you Injections in left shoulder today  Get x ray on your way out Keep hands in Periferal view  Exercises given  Follow up in 6 weeks

## 2022-05-30 ENCOUNTER — Encounter: Payer: Self-pay | Admitting: Family Medicine

## 2022-05-30 NOTE — Progress Notes (Deleted)
Kayla Jackson Phone: [phone] Subjective:    I'm seeing this patient by the request  of:  Maurice Small, MD  CC:   [id number]  04/19/2022 Patient given injection and tolerated the procedure well, discussed icing regimen and home exercises, which activities to do and which ones to avoid. Follow-up again in 6 to 8 weeks   Side and seems to be consistent with more of patient's symptoms.  Discussed with patient about icing regimen and home exercises, which activities to do and which ones to avoid.  Discussed icing regimen and home exercises.  Patient to work with Product/process development scientist.  Follow-up again in 6 to 8 weeks.   Update 06/04/2022 Kayla Jackson is a 52 y.o. female coming in with complaint of L shoulder pain. Patient states   Xray L shoulder 04/19/2022 IMPRESSION: Mild degenerative arthritis glenohumeral and AC joints.     No past medical history on file. Past Surgical History:  Procedure Laterality Date   BREAST CYST ASPIRATION Left    calcified milk duct   CESAREAN SECTION     Social History   Socioeconomic History   Marital status: Married    Spouse name: Not on file   Number of children: Not on file   Years of education: Not on file   Highest education level: Not on file  Occupational History   Not on file  Tobacco Use   Smoking status: Never   Smokeless tobacco: Never  Vaping Use   Vaping Use: Never used  Substance and Sexual Activity   Alcohol use: Yes    Comment: occ   Drug use: No   Sexual activity: Not on file  Other Topics Concern   Not on file  Social History Narrative   Not on file   Social Determinants of Health   Financial Resource Strain: Not on file  Food Insecurity: Not on file  Transportation Needs: Not on file  Physical Activity: Not on file  Stress: Not on file  Social Connections: Not on file   No Known Allergies No family history on file.  Current Outpatient  Medications (Endocrine & Metabolic):    levonorgestrel (MIRENA) 20 MCG/DAY IUD, 1 each by Intrauterine route once.  Current Outpatient Medications (Cardiovascular):    nitroGLYCERIN (NITRO-DUR) 0.1 mg/hr patch, 1/4 patch daily (Patient not taking: Reported on 04/19/2022)   Current Outpatient Medications (Analgesics):    ibuprofen (ADVIL) 600 MG tablet, Take 600 mg by mouth daily.   Current Outpatient Medications (Other):    gabapentin (NEURONTIN) 100 MG capsule, Take 2 capsules (200 mg total) by mouth at bedtime. (Patient not taking: Reported on 04/19/2022)   Vitamin D, Ergocalciferol, (DRISDOL) 1.25 MG (50000 UNIT) CAPS capsule, Take 1 capsule (50,000 Units total) by mouth every 7 (seven) days.   Reviewed prior external information including notes and imaging from  primary care provider As well as notes that were available from care everywhere and other healthcare systems.  Past medical history, social, surgical and family history all reviewed in electronic medical record.  No pertanent information unless stated regarding to the chief complaint.   Review of Systems:  No headache, visual changes, nausea, vomiting, diarrhea, constipation, dizziness, abdominal pain, skin rash, fevers, chills, night sweats, weight loss, swollen lymph nodes, body aches, joint swelling, chest pain, shortness of breath, mood changes. POSITIVE muscle aches  Objective  There were no vitals taken for this visit.   General: No apparent distress alert and  oriented x3 mood and affect normal, dressed appropriately.  HEENT: Pupils equal, extraocular movements intact  Respiratory: Patient's speak in full sentences and does not appear short of breath  Cardiovascular: No lower extremity edema, non tender, no erythema      Impression and Recommendations:

## 2022-06-04 ENCOUNTER — Ambulatory Visit: Payer: No Typology Code available for payment source | Admitting: Family Medicine

## 2022-08-22 ENCOUNTER — Other Ambulatory Visit (HOSPITAL_BASED_OUTPATIENT_CLINIC_OR_DEPARTMENT_OTHER): Payer: Self-pay

## 2023-02-24 NOTE — Progress Notes (Unsigned)
[name] [address] [place] [address] Phone: [phone] Subjective:   Kayla Jackson, am serving as a scribe for Dr. Antoine Primas.  I'm seeing this patient by the request  of:  Shirlean Mylar, MD  CC: Back and right hip pain  [id number]  04/19/2022 Patient given injection and tolerated the procedure well, discussed icing regimen and home exercises, which activities to do and which ones to avoid.  Follow-up again in 6 to 8 weeks     Side and seems to be consistent with more of patient's symptoms.  Discussed with patient about icing regimen and home exercises, which activities to do and which ones to avoid.  Discussed icing regimen and home exercises.  Patient to work with Event organiser.  Follow-up again in 6 to 8 weeks.     Updated 02/25/2023 Kayla Jackson is a 52 y.o. female coming in with complaint of R hip pain, right knee pain as well as back pain. Pain over ASIS and into groin when getting into the car. Has to use her hands to help leg into car. Also notices a tightness in mornings that feels like hip needs to pop.   Does see chiro for piriformis syndrome which is helpful to glute.   History of TKR in R side. Unable to run due to instability.       No past medical history on file. Past Surgical History:  Procedure Laterality Date   BREAST CYST ASPIRATION Left    calcified milk duct   CESAREAN SECTION     Social History   Socioeconomic History   Marital status: Married    Spouse name: Not on file   Number of children: Not on file   Years of education: Not on file   Highest education level: Not on file  Occupational History   Not on file  Tobacco Use   Smoking status: Never   Smokeless tobacco: Never  Vaping Use   Vaping status: Never Used  Substance and Sexual Activity   Alcohol use: Yes    Comment: occ   Drug use: No   Sexual activity: Not on file  Other Topics Concern   Not on file  Social History  Narrative   Not on file   Social Determinants of Health   Financial Resource Strain: Not on file  Food Insecurity: Not on file  Transportation Needs: Not on file  Physical Activity: Not on file  Stress: Not on file  Social Connections: Not on file   No Known Allergies No family history on file.  Current Outpatient Medications (Endocrine & Metabolic):    levonorgestrel (MIRENA) 20 MCG/DAY IUD, 1 each by Intrauterine route once.  Current Outpatient Medications (Cardiovascular):    nitroGLYCERIN (NITRO-DUR) 0.1 mg/hr patch, 1/4 patch daily   Current Outpatient Medications (Analgesics):    ibuprofen (ADVIL) 600 MG tablet, Take 600 mg by mouth daily.   meloxicam (MOBIC) 15 MG tablet, Take 1 tablet (15 mg total) by mouth daily.   Current Outpatient Medications (Other):    gabapentin (NEURONTIN) 100 MG capsule, Take 2 capsules (200 mg total) by mouth at bedtime.   Vitamin D, Ergocalciferol, (DRISDOL) 1.25 MG (50000 UNIT) CAPS capsule, Take 1 capsule (50,000 Units total) by mouth every 7 (seven) days.   Reviewed prior external information including notes and imaging from  primary care provider As well as notes that were available from care everywhere and other healthcare systems.  Past medical history, social, surgical and  family history all reviewed in electronic medical record.  No pertanent information unless stated regarding to the chief complaint.   Review of Systems:  No headache, visual changes, nausea, vomiting, diarrhea, constipation, dizziness, abdominal pain, skin rash, fevers, chills, night sweats, weight loss, swollen lymph nodes, body aches, joint swelling, chest pain, shortness of breath, mood changes. POSITIVE muscle aches  Objective  Blood pressure 118/76, pulse 83, height 5\' 5"  (1.651 m), weight 170 lb (77.1 kg).   General: No apparent distress alert and oriented x3 mood and affect normal, dressed appropriately.  HEENT: Pupils equal, extraocular movements intact   Respiratory: Patient's speak in full sentences and does not appear short of breath  Cardiovascular: No lower extremity edema, non tender, no erythema  Right hip exam shows the patient does have some decrease in internal range of motion.  Positive impingement noted.    Impression and Recommendations:     Right hip impingement syndrome Patient does have significant arthritis that has progressed fairly significantly.  Discussed which activities to do and which ones to avoid.  Patient will start with aquatic therapy that I think would be beneficial.  We discussed meloxicam and given a prescription as well.  Increase activity slowly.  Discussed with her the possibility of advanced imaging but I think we should be okay with conservative therapy for some time.  Patient may need a replacement at some point as well.  Follow-up with me again in 8 weeks

## 2023-02-25 ENCOUNTER — Encounter: Payer: Self-pay | Admitting: Family Medicine

## 2023-02-25 ENCOUNTER — Other Ambulatory Visit: Payer: Self-pay

## 2023-02-25 ENCOUNTER — Ambulatory Visit: Payer: No Typology Code available for payment source | Admitting: Family Medicine

## 2023-02-25 ENCOUNTER — Ambulatory Visit (INDEPENDENT_AMBULATORY_CARE_PROVIDER_SITE_OTHER): Payer: No Typology Code available for payment source

## 2023-02-25 VITALS — BP 118/76 | HR 83 | Ht 65.0 in | Wt 170.0 lb

## 2023-02-25 DIAGNOSIS — M25851 Other specified joint disorders, right hip: Secondary | ICD-10-CM | POA: Diagnosis not present

## 2023-02-25 DIAGNOSIS — M25551 Pain in right hip: Secondary | ICD-10-CM

## 2023-02-25 DIAGNOSIS — M545 Low back pain, unspecified: Secondary | ICD-10-CM

## 2023-02-25 MED ORDER — MELOXICAM 15 MG PO TABS: 15.0000 mg | ORAL_TABLET | Freq: Every day | ORAL | 0 refills | Status: DC

## 2023-02-25 NOTE — Patient Instructions (Signed)
Aquatic PT  Meloxicam 15mg  for 10 days then in 5 day bursts Tylenol  See me in 8 weeks

## 2023-02-25 NOTE — Assessment & Plan Note (Signed)
Patient does have significant arthritis that has progressed fairly significantly.  Discussed which activities to do and which ones to avoid.  Patient will start with aquatic therapy that I think would be beneficial.  We discussed meloxicam and given a prescription as well.  Increase activity slowly.  Discussed with her the possibility of advanced imaging but I think we should be okay with conservative therapy for some time.  Patient may need a replacement at some point as well.  Follow-up with me again in 8 weeks

## 2023-04-17 NOTE — Progress Notes (Deleted)
 [name] [address] [place] [address] Phone: [phone] Subjective:    I'm seeing this patient by the request  of:  Douglass Ivanoff, MD  CC:   [id number]  02/25/2023 Patient does have significant arthritis that has progressed fairly significantly. Discussed which activities to do and which ones to avoid. Patient will start with aquatic therapy that I think would be beneficial. We discussed meloxicam  and given a prescription as well. Increase activity slowly. Discussed with her the possibility of advanced imaging but I think we should be okay with conservative therapy for some time. Patient may need a replacement at some point as well. Follow-up with me again in 8 weeks   Updated 04/22/2023 Kayla Jackson is a 53 y.o. female coming in with complaint of R hip and back pain       No past medical history on file. Past Surgical History:  Procedure Laterality Date   BREAST CYST ASPIRATION Left    calcified milk duct   CESAREAN SECTION     Social History   Socioeconomic History   Marital status: Married    Spouse name: Not on file   Number of children: Not on file   Years of education: Not on file   Highest education level: Not on file  Occupational History   Not on file  Tobacco Use   Smoking status: Never   Smokeless tobacco: Never  Vaping Use   Vaping status: Never Used  Substance and Sexual Activity   Alcohol use: Yes    Comment: occ   Drug use: No   Sexual activity: Not on file  Other Topics Concern   Not on file  Social History Narrative   Not on file   Social Drivers of Health   Financial Resource Strain: Not on file  Food Insecurity: Not on file  Transportation Needs: Not on file  Physical Activity: Not on file  Stress: Not on file  Social Connections: Not on file   No Known Allergies No family history on file.  Current Outpatient Medications (Endocrine & Metabolic):    levonorgestrel  (MIRENA ) 20 MCG/DAY  IUD, 1 each by Intrauterine route once.  Current Outpatient Medications (Cardiovascular):    nitroGLYCERIN  (NITRO-DUR ) 0.1 mg/hr patch, 1/4 patch daily   Current Outpatient Medications (Analgesics):    ibuprofen (ADVIL) 600 MG tablet, Take 600 mg by mouth daily.   meloxicam  (MOBIC ) 15 MG tablet, Take 1 tablet (15 mg total) by mouth daily.   Current Outpatient Medications (Other):    gabapentin  (NEURONTIN ) 100 MG capsule, Take 2 capsules (200 mg total) by mouth at bedtime.   Vitamin D , Ergocalciferol , (DRISDOL ) 1.25 MG (50000 UNIT) CAPS capsule, Take 1 capsule (50,000 Units total) by mouth every 7 (seven) days.   Reviewed prior external information including notes and imaging from  primary care provider As well as notes that were available from care everywhere and other healthcare systems.  Past medical history, social, surgical and family history all reviewed in electronic medical record.  No pertanent information unless stated regarding to the chief complaint.   Review of Systems:  No headache, visual changes, nausea, vomiting, diarrhea, constipation, dizziness, abdominal pain, skin rash, fevers, chills, night sweats, weight loss, swollen lymph nodes, body aches, joint swelling, chest pain, shortness of breath, mood changes. POSITIVE muscle aches  Objective  There were no vitals taken for this visit.   General: No apparent distress alert and oriented x3 mood and affect normal, dressed appropriately.  HEENT:  Pupils equal, extraocular movements intact  Respiratory: Patient's speak in full sentences and does not appear short of breath  Cardiovascular: No lower extremity edema, non tender, no erythema      Impression and Recommendations:

## 2023-04-20 ENCOUNTER — Encounter: Payer: Self-pay | Admitting: Family Medicine

## 2023-04-21 MED ORDER — MELOXICAM 15 MG PO TABS: 15.0000 mg | ORAL_TABLET | Freq: Every day | ORAL | 3 refills | Status: AC

## 2023-04-22 ENCOUNTER — Ambulatory Visit: Payer: No Typology Code available for payment source | Admitting: Family Medicine

## 2023-12-23 ENCOUNTER — Other Ambulatory Visit (HOSPITAL_COMMUNITY)
Admission: RE | Admit: 2023-12-23 | Discharge: 2023-12-23 | Disposition: A | Discharge status: Home / Self Care | Source: Ambulatory Visit | Attending: Obstetrics & Gynecology | Admitting: Obstetrics & Gynecology

## 2023-12-23 ENCOUNTER — Encounter: Payer: Self-pay | Admitting: Obstetrics & Gynecology

## 2023-12-23 ENCOUNTER — Ambulatory Visit (INDEPENDENT_AMBULATORY_CARE_PROVIDER_SITE_OTHER): Admitting: Obstetrics & Gynecology

## 2023-12-23 VITALS — BP 133/81 | HR 79 | Ht 65.0 in | Wt 177.0 lb

## 2023-12-23 DIAGNOSIS — Z124 Encounter for screening for malignant neoplasm of cervix: Secondary | ICD-10-CM

## 2023-12-23 DIAGNOSIS — Z1231 Encounter for screening mammogram for malignant neoplasm of breast: Secondary | ICD-10-CM

## 2023-12-23 DIAGNOSIS — Z01419 Encounter for gynecological examination (general) (routine) without abnormal findings: Secondary | ICD-10-CM | POA: Diagnosis not present

## 2023-12-23 DIAGNOSIS — Z30432 Encounter for removal of intrauterine contraceptive device: Secondary | ICD-10-CM | POA: Diagnosis not present

## 2023-12-23 DIAGNOSIS — K644 Residual hemorrhoidal skin tags: Secondary | ICD-10-CM

## 2023-12-23 NOTE — Progress Notes (Signed)
 WELL-WOMAN EXAMINATION Patient name: Kayla Jackson MRN [id number]  Date of birth: 03-05-1971 Chief Complaint:   Annual Exam  History of Present Illness:   Kayla Jackson is a 53 y.o. G66P2003 female being seen today for a routine well-woman exam.   Of note patient is a prior [facility] patient and notes that she has not been seen since that time.  Old records not present.  Mirena  placed at that visit in Winter of either 2017 or 18 with difficulty.  Denies any vaginal bleeding since then.  Denies vaginal discharge, itching or irritation.  Denies significant menopausal symptoms; however, she has noted typically once a month urinary symptoms such as urgency frequency that sends chills throughout her body.  Urine analysis has been completed multiple times and always negative.  Additionally patient has been struggling with rectal prolapse, she is able to reduce the area but notes significant discomfort especially with exercise.  She was seen at [facility] and after much discussion has postponed surgery due to concern for potential complications and it seems as though the physician felt the risk outweighed the benefit  No LMP recorded (lmp unknown). (Menstrual status: IUD).  The current method of family planning is IUD.    Last pap collected today.  Last mammogram: ordered. Last colonoscopy: 2025     12/23/2023    3:39 PM  Depression screen PHQ 2/9  Decreased Interest 0  Down, Depressed, Hopeless 0  PHQ - 2 Score 0  Altered sleeping 2  Tired, decreased energy 0  Change in appetite 0  Feeling bad or failure about yourself  0  Trouble concentrating 0  Moving slowly or fidgety/restless 0  Suicidal thoughts 0  PHQ-9 Score 2      Review of Systems:   Pertinent items are noted in HPI Denies any headaches, blurred vision, fatigue, shortness of breath, chest pain, abdominal pain, bowel movements, urination, or intercourse unless otherwise stated above.  Pertinent History Reviewed:   Reviewed past medical,surgical, social and family history.  Reviewed problem list, medications and allergies. Physical Assessment:   Vitals:   12/23/23 1521  BP: 133/81  Pulse: 79  Weight: 177 lb (80.3 kg)  Height: 5' 5 (1.651 m)  Body mass index is 29.45 kg/m.        Physical Examination:   General appearance - well appearing, and in no distress  Mental status - alert, oriented to person, place, and time  Psych:  She has a normal mood and affect  Skin - warm and dry, normal color, no suspicious lesions noted  Chest - effort normal, all lung fields clear to auscultation bilaterally  Heart - normal rate and regular rhythm  Neck:  midline trachea, no thyromegaly or nodules  Breasts - breasts appear normal, no suspicious masses, no skin or nipple changes or  axillary nodes  Abdomen - soft, nontender, nondistended, no masses or organomegaly  Pelvic - VULVA: normal appearing vulva with no masses, tenderness or lesions  VAGINA: normal appearing vagina with normal color and discharge, no lesions  CERVIX: normal appearing cervix without discharge or lesions, no CMT, strings visualized at cervical os  Thin prep pap is done with HR HPV cotesting  UTERUS: uterus is felt to be normal size, shape, consistency and nontender   ADNEXA: No adnexal masses or tenderness noted.  Rectal -external hemorrhoid noted  Extremities:  No swelling or varicosities noted  Chaperone: Aleck Blase     IUD REMOVAL   Verbal consent obtained  A sterile speculum  was placed in the vagina.  The cervix was visualized, and the strings visible. They were grasped and the Mirena   IUD was easily removed intact without complications. The patient tolerated the procedure well.     Assessment & Plan:  1) Well-Woman Exam -Pap collected, reviewed ASCCP guidelines - Mammogram ordered  2) IUD - Device expired, removed without any issue - Plan to monitor menses  []  plan to monitor for symptoms of  menopause   Orders Placed This Encounter  Procedures   MM 3D SCREENING MAMMOGRAM BILATERAL BREAST    Meds: No orders of the defined types were placed in this encounter.   Follow-up: Return in about 1 year (around 12/22/2024) for  Annual, []  [facility] records.   Cornell Bourbon, DO Attending Obstetrician & Gynecologist, [facility] for [employer], [facility]

## 2023-12-25 ENCOUNTER — Ambulatory Visit: Payer: Self-pay | Admitting: Obstetrics & Gynecology

## 2023-12-25 LAB — CYTOLOGY - PAP
Comment: NEGATIVE
Diagnosis: NEGATIVE
High risk HPV: NEGATIVE
